# Patient Record
Sex: Female | Born: 1985
Health system: Southern US, Community
[De-identification: ages and names within clinical notes are randomized; demographics above are authoritative.]

## PROBLEM LIST (undated history)

## (undated) DIAGNOSIS — Z789 Other specified health status: Secondary | ICD-10-CM

## (undated) HISTORY — PX: TONSILLECTOMY: SUR1361

---
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
## 2007-08-25 ENCOUNTER — Encounter (INDEPENDENT_AMBULATORY_CARE_PROVIDER_SITE_OTHER): Payer: Self-pay | Admitting: Otolaryngology

## 2007-08-25 ENCOUNTER — Ambulatory Visit (HOSPITAL_BASED_OUTPATIENT_CLINIC_OR_DEPARTMENT_OTHER): Admission: RE | Admit: 2007-08-25 | Discharge: 2007-08-26 | Payer: Self-pay | Admitting: Otolaryngology

## 2011-01-23 NOTE — Op Note (Signed)
NAMEMIHIRA, TOZZI                 ACCOUNT NO.:  [id number]   MEDICAL RECORD NO.:  [id number]          PATIENT TYPE:  AMB   LOCATION:  DSC                          FACILITY:  [facility]   PHYSICIAN:  Karol T. Lazarus Salines, M.D. DATE OF BIRTH:  [birth year]   DATE OF PROCEDURE:  08/25/2007  DATE OF DISCHARGE:                               OPERATIVE REPORT   PREOPERATIVE DIAGNOSIS:  Recurrent tonsillitis.   POSTOPERATIVE DIAGNOSIS:  Recurrent tonsillitis.   PROCEDURE PERFORMED:  Tonsillectomy.   SURGEON:  Gloris Manchester. Lazarus Salines, M.D.   ANESTHESIA:  General orotracheal.   BLOOD LOSS:  None.   COMPLICATIONS:  None.   FINDINGS:  1+ imbedded tonsils with moderate fibrosis.  Normal soft  palate.  No residual adenoids.  Some congestion of the inferior  turbinates.   PROCEDURE:  With the patient in the comfortable supine position, general  orotracheal anesthesia was induced without difficulty.  At an  appropriate level, table was turned 90 degrees and the patient placed in  Trendelenburg.  A clean preparation and draping was accomplished.  Taking care to protect lips, teeth, and endotracheal tube, the Crowe-  Davis mouth gag was introduced, expanded for visualization, and  suspended from the Mayo stand in the standard fashion.  The findings  were as described above.  Palate retractor and mirror were used to  visualize the nasopharynx with the findings as described above.  Anterior nose was examined.  The nasal speculum the findings as  described above.  No adenoids were noted and no adenoidectomy was felt  necessary.  0.5% Xylocaine with 1:200,000 epinephrine, 8 mL total was  infiltrated into the peritonsillar planes for intraoperative hemostasis.  Several minutes were allowed for this to take effect.   Beginning on the left side, the tonsil was grasped and retracted  medially.  The mucosa overlying the anterior and superior poles was  coagulated and then cut down to the capsule of the tonsil.   Using the  cautery tip as a blunt dissector, lysing fibrous bands, and coagulating  crossing vessels as identified, the tonsil was dissected from its  muscular fossa from inferiorly upward.  A small additional quantity of  cautery rendered the fossa hemostatic.  The tonsil was removed in its  entirety as determined by examination both tonsil and fossa.   After completing left side, the right side was done in identical  fashion.   After completing both sides, hemostasis was observed.  The mouth gag was  relaxed for several minutes.  Upon re-expansion, hemostasis was  persistent.  At this point the procedure was completed.  Mouth gag was  relaxed and removed.  The dental status was intact.  The patient was  returned to Anesthesia, awakened, extubated, and transferred to recovery  in stable condition.   COMMENT:  25 year old white female with recurrent history of sore  throats was the indication for today's procedure.  Anticipate routine  postoperative recovery with attention to analgesia, antibiosis,  hydration, and observation for bleeding, emesis, or airway compromise.      Gloris Manchester. Lazarus Salines, M.D.  Electronically Signed  KTW/MEDQ  D:  08/25/2007  T:  08/25/2007  Job:  [id number]   cc:   Ernestina Penna, M.D.

## 2011-06-15 LAB — POCT HEMOGLOBIN-HEMACUE
Hemoglobin: 13.9
Operator id: 117931

## 2011-09-17 ENCOUNTER — Other Ambulatory Visit: Payer: Self-pay | Admitting: Family Medicine

## 2011-09-17 ENCOUNTER — Ambulatory Visit
Admission: RE | Admit: 2011-09-17 | Discharge: 2011-09-17 | Disposition: A | Discharge status: Home / Self Care | Payer: BC Managed Care – PPO | Source: Ambulatory Visit | Attending: Family Medicine | Admitting: Family Medicine

## 2011-09-17 DIAGNOSIS — M79604 Pain in right leg: Secondary | ICD-10-CM

## 2011-11-05 ENCOUNTER — Encounter (HOSPITAL_COMMUNITY): Payer: Self-pay | Admitting: Emergency Medicine

## 2011-11-05 ENCOUNTER — Emergency Department (HOSPITAL_COMMUNITY): Payer: BC Managed Care – PPO

## 2011-11-05 ENCOUNTER — Emergency Department (HOSPITAL_COMMUNITY)
Admission: EM | Admit: 2011-11-05 | Discharge: 2011-11-05 | Disposition: A | Discharge status: Home / Self Care | Payer: BC Managed Care – PPO | Attending: Emergency Medicine | Admitting: Emergency Medicine

## 2011-11-05 DIAGNOSIS — R10819 Abdominal tenderness, unspecified site: Secondary | ICD-10-CM | POA: Insufficient documentation

## 2011-11-05 DIAGNOSIS — Z79899 Other long term (current) drug therapy: Secondary | ICD-10-CM | POA: Insufficient documentation

## 2011-11-05 DIAGNOSIS — F17200 Nicotine dependence, unspecified, uncomplicated: Secondary | ICD-10-CM | POA: Insufficient documentation

## 2011-11-05 DIAGNOSIS — R1031 Right lower quadrant pain: Secondary | ICD-10-CM | POA: Insufficient documentation

## 2011-11-05 LAB — URINALYSIS, ROUTINE W REFLEX MICROSCOPIC
Bilirubin Urine: NEGATIVE
Glucose, UA: NEGATIVE mg/dL
Hgb urine dipstick: NEGATIVE
Ketones, ur: NEGATIVE mg/dL
Leukocytes, UA: NEGATIVE
Nitrite: NEGATIVE
Protein, ur: NEGATIVE mg/dL
Specific Gravity, Urine: 1.01 (ref 1.005–1.030)
Urobilinogen, UA: 0.2 mg/dL (ref 0.0–1.0)
pH: 7.5 (ref 5.0–8.0)

## 2011-11-05 LAB — COMPREHENSIVE METABOLIC PANEL
ALT: 48 U/L — ABNORMAL HIGH (ref 0–35)
AST: 134 U/L — ABNORMAL HIGH (ref 0–37)
Albumin: 4.1 g/dL (ref 3.5–5.2)
Alkaline Phosphatase: 58 U/L (ref 39–117)
BUN: 9 mg/dL (ref 6–23)
CO2: 25 mEq/L (ref 19–32)
Calcium: 9.4 mg/dL (ref 8.4–10.5)
Chloride: 104 mEq/L (ref 96–112)
Creatinine, Ser: 0.69 mg/dL (ref 0.50–1.10)
GFR calc Af Amer: >90 mL/min (ref >90)
GFR calc non Af Amer: >90 mL/min (ref >90)
Glucose, Bld: 96 mg/dL (ref 70–99)
Potassium: 3.7 mEq/L (ref 3.5–5.1)
Sodium: 138 mEq/L (ref 135–145)
Total Bilirubin: 0.5 mg/dL (ref 0.3–1.2)
Total Protein: 7.1 g/dL (ref 6.0–8.3)

## 2011-11-05 LAB — CBC
HCT: 40.3 % (ref 36.0–46.0)
Hemoglobin: 13.8 g/dL (ref 12.0–15.0)
MCH: 31.4 pg (ref 26.0–34.0)
MCHC: 34.2 g/dL (ref 30.0–36.0)
MCV: 91.6 fL (ref 78.0–100.0)
Platelets: 194 10*3/uL (ref 150–400)
RBC: 4.4 MIL/uL (ref 3.87–5.11)
RDW: 12.2 % (ref 11.5–15.5)
WBC: 9.1 10*3/uL (ref 4.0–10.5)

## 2011-11-05 LAB — DIFFERENTIAL
Basophils Absolute: 0 10*3/uL (ref 0.0–0.1)
Basophils Relative: 0 % (ref 0–1)
Eosinophils Absolute: 0.1 10*3/uL (ref 0.0–0.7)
Eosinophils Relative: 1 % (ref 0–5)
Lymphocytes Relative: 15 % (ref 12–46)
Lymphs Abs: 1.4 10*3/uL (ref 0.7–4.0)
Monocytes Absolute: 0.5 10*3/uL (ref 0.1–1.0)
Monocytes Relative: 5 % (ref 3–12)
Neutro Abs: 7.2 10*3/uL (ref 1.7–7.7)
Neutrophils Relative %: 79 % — ABNORMAL HIGH (ref 43–77)

## 2011-11-05 LAB — PREGNANCY, URINE: Preg Test, Ur: NEGATIVE

## 2011-11-05 LAB — LIPASE, BLOOD: Lipase: 22 U/L (ref 11–59)

## 2011-11-05 MED ORDER — ONDANSETRON HCL 4 MG/2ML IJ SOLN
INTRAMUSCULAR | Status: AC
  Filled 2011-11-05: qty 2

## 2011-11-05 MED ORDER — IOHEXOL 300 MG/ML  SOLN: 20.0000 mL | INTRAMUSCULAR | Status: AC

## 2011-11-05 MED ORDER — PROMETHAZINE HCL 25 MG PO TABS: 25.0000 mg | ORAL_TABLET | Freq: Four times a day (QID) | ORAL | Status: AC | PRN

## 2011-11-05 MED ORDER — PERCOCET 5-325 MG PO TABS: 1.0000 | ORAL_TABLET | Freq: Four times a day (QID) | ORAL | Status: AC | PRN

## 2011-11-05 MED ORDER — IOHEXOL 300 MG/ML  SOLN
80.0000 mL | Freq: Once | INTRAMUSCULAR | Status: AC | PRN
  Administered 2011-11-05: 80 mL via INTRAVENOUS

## 2011-11-05 MED ORDER — ONDANSETRON HCL 4 MG/2ML IJ SOLN
4.0000 mg | Freq: Once | INTRAMUSCULAR | Status: AC
  Administered 2011-11-05: 4 mg via INTRAVENOUS

## 2011-11-05 MED ORDER — ONDANSETRON HCL 4 MG/2ML IJ SOLN
4.0000 mg | Freq: Once | INTRAMUSCULAR | Status: AC
  Administered 2011-11-05: 4 mg via INTRAVENOUS
  Filled 2011-11-05: qty 2

## 2011-11-05 MED ORDER — SODIUM CHLORIDE 0.9 % IV BOLUS (SEPSIS)
1000.0000 mL | Freq: Once | INTRAVENOUS | Status: AC
  Administered 2011-11-05: 1000 mL via INTRAVENOUS

## 2011-11-05 NOTE — ED Notes (Signed)
Pt c/o RLQ pain; pt seen by PCP and sent here for eval for possible appy; pt sts nausea; pt denies UTI sx; pt sts had UA and upreg at PCP

## 2011-11-05 NOTE — Discharge Instructions (Signed)
Your testing here today only showed that your liver enzymes were mildly elevated. You will need to recheck with your doctor who may need to refer your to a GI specialist. This could be an evolving process that has not shown up on any of our testing here today. Monitor your condition and if there is any changes or worsening in your condition.

## 2011-11-05 NOTE — ED Provider Notes (Signed)
History     CSN: [id number]  Arrival date & time 11/05/11  1191   First MD Initiated Contact with Patient 11/05/11 0945      Chief Complaint  Patient presents with  . Abdominal Pain    (Consider location/radiation/quality/duration/timing/severity/associated sxs/prior treatment) HPI Patient presents to the ED complaining of RLQ abdominal pain that began around 2am this morning. She was seen at her PCP office and referred here for work up of possible appendicitis. Patient states that the pain is a heavy pressure that intermittently increases in intensity every few min and lasts for a few seconds at a time. She also had two episodes of vomiting this morning. She was unable to eat breakfast because of nausea. She has never had any pain like this before. LMP Oct 24, 2011. Patient denies fever, chills, sweats. She denies chest pain, shortness of breath, dizziness, lightheadedness, diarrhea, or changes in bowel habits. She denies dysuria, vaginal bleeding or discharge. Patient has never had abdominal surgery. Patient takes Abilify, Strattera, and Celexa. She is allergic to hydrocodone.  History reviewed. No pertinent past medical history.  History reviewed. No pertinent past surgical history.  History reviewed. No pertinent family history.  History  Substance Use Topics  . Smoking status: Current Everyday Smoker  . Smokeless tobacco: Not on file  . Alcohol Use: Yes     occasional    OB History    Grav Para Term Preterm Abortions TAB SAB Ect Mult Living                  Review of Systems All pertinent positives and negatives reviewed in the history of present illness  Allergies  Review of patient's allergies indicates no known allergies.  Home Medications   Current Outpatient Rx  Name Route Sig Dispense Refill  . ARIPIPRAZOLE 2 MG PO TABS Oral Take 2 mg by mouth daily.    . ATOMOXETINE HCL 80 MG PO CAPS Oral Take 80 mg by mouth daily.    Marland Kitchen CITALOPRAM HYDROBROMIDE 20 MG PO  TABS Oral Take 20 mg by mouth daily.      BP 116/72  Pulse 103  Temp(Src) 98 F (36.7 C) (Oral)  Resp 20  SpO2 100%  Physical Exam  Constitutional: She appears well-developed and well-nourished. No distress.  HENT:  Head: Normocephalic and atraumatic.  Neck: Neck supple.  Cardiovascular: Normal rate, regular rhythm and normal heart sounds.   Pulmonary/Chest: Effort normal and breath sounds normal. No respiratory distress.  Abdominal: Soft. Bowel sounds are normal. She exhibits no distension. There is tenderness (RLQ) in the right upper quadrant and right lower quadrant. There is tenderness at McBurney's point (negative Rovsings sign). There is no rigidity, no rebound and no guarding.    Lymphadenopathy:    She has no cervical adenopathy.  Skin: She is not diaphoretic.    ED Course  Procedures (including critical care time   The patient has been stable here in the ER. The patient has normal CT and [place] here in the ER. The patient states that she drinks regularly but will not give me a definite amount. The patient has some elevated liver enzymes but otherwise normal lab testing.She will be referred back to her PCP for further evaluation. I explained that the testing did not show any significant issue today but that this could be an evolving process and that she will need to return here for any worsening in her condition.   MDM  MDM Reviewed: nursing note and vitals  Interpretation: labs, ultrasound and CT scan            Carlyle Dolly, PA-C 11/05/11 1525  Carlyle Dolly, PA-C 11/05/11 1556

## 2011-11-05 NOTE — ED Notes (Signed)
Patient transported to CT 

## 2011-11-05 NOTE — ED Provider Notes (Signed)
Medical screening examination/treatment/procedure(s) were performed by non-physician practitioner and as supervising physician I was immediately available for consultation/collaboration. Devoria Albe, MD, FACEP   Ward Givens, MD 11/05/11 Ernestina Columbia

## 2014-07-20 ENCOUNTER — Other Ambulatory Visit: Payer: Self-pay | Admitting: Gynecology

## 2014-07-21 ENCOUNTER — Other Ambulatory Visit: Payer: Self-pay | Admitting: Internal Medicine

## 2014-07-21 DIAGNOSIS — E049 Nontoxic goiter, unspecified: Secondary | ICD-10-CM

## 2014-07-21 LAB — CYTOLOGY - PAP

## 2014-07-22 ENCOUNTER — Ambulatory Visit
Admission: RE | Admit: 2014-07-22 | Discharge: 2014-07-22 | Disposition: A | Discharge status: Home / Self Care | Payer: BC Managed Care – PPO | Source: Ambulatory Visit | Attending: Internal Medicine | Admitting: Internal Medicine

## 2014-07-22 DIAGNOSIS — E049 Nontoxic goiter, unspecified: Secondary | ICD-10-CM

## 2014-07-26 ENCOUNTER — Other Ambulatory Visit: Payer: Self-pay | Admitting: Internal Medicine

## 2014-07-26 DIAGNOSIS — E041 Nontoxic single thyroid nodule: Secondary | ICD-10-CM

## 2015-01-24 ENCOUNTER — Ambulatory Visit
Admission: RE | Admit: 2015-01-24 | Discharge: 2015-01-24 | Disposition: A | Discharge status: Home / Self Care | Payer: BLUE CROSS/BLUE SHIELD | Source: Ambulatory Visit | Attending: Internal Medicine | Admitting: Internal Medicine

## 2015-01-24 DIAGNOSIS — E041 Nontoxic single thyroid nodule: Secondary | ICD-10-CM

## 2015-10-19 ENCOUNTER — Other Ambulatory Visit: Payer: Self-pay | Admitting: Internal Medicine

## 2015-10-19 DIAGNOSIS — E041 Nontoxic single thyroid nodule: Secondary | ICD-10-CM

## 2015-11-18 ENCOUNTER — Other Ambulatory Visit: Payer: BLUE CROSS/BLUE SHIELD

## 2016-02-14 ENCOUNTER — Other Ambulatory Visit: Payer: BLUE CROSS/BLUE SHIELD

## 2016-02-24 ENCOUNTER — Ambulatory Visit
Admission: RE | Admit: 2016-02-24 | Discharge: 2016-02-24 | Disposition: A | Discharge status: Home / Self Care | Payer: BLUE CROSS/BLUE SHIELD | Source: Ambulatory Visit | Attending: Internal Medicine | Admitting: Internal Medicine

## 2016-02-24 DIAGNOSIS — E041 Nontoxic single thyroid nodule: Secondary | ICD-10-CM

## 2016-09-17 IMAGING — US US SOFT TISSUE HEAD/NECK
1 series · 14 of 25 positions shown · non-contrast
Comparison: None.

CLINICAL DATA: 28-year-old female with a history of thyroid
enlargement.

EXAM:
THYROID ULTRASOUND
TECHNIQUE: Ultrasound examination of the thyroid gland and adjacent soft
tissues was performed.

[Series 1: us soft tissue head/neck · 0.07mm/px · 14 of 40 slices shown]
[im 1/40]
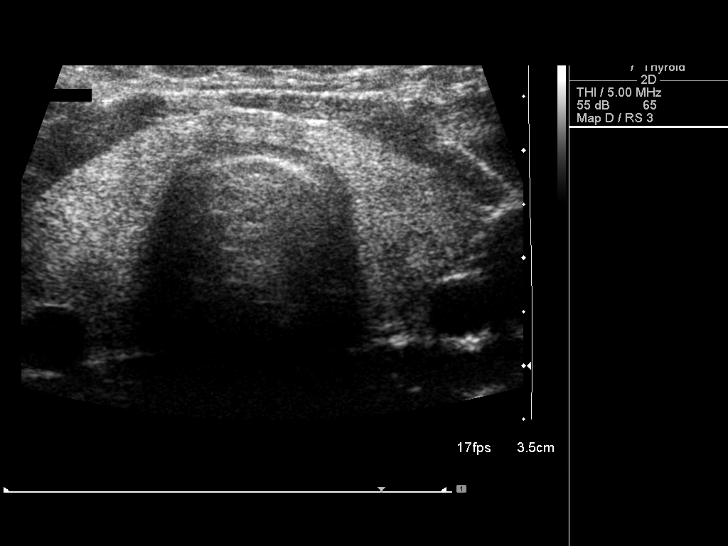
[im 4/40]
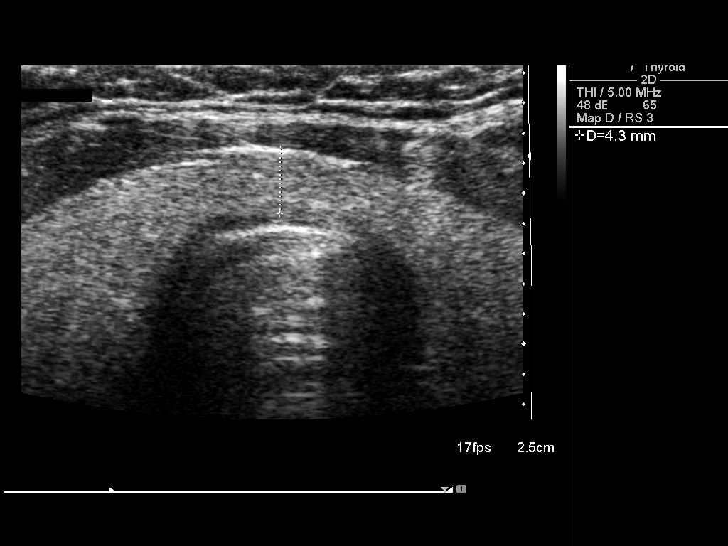
[im 7/40]
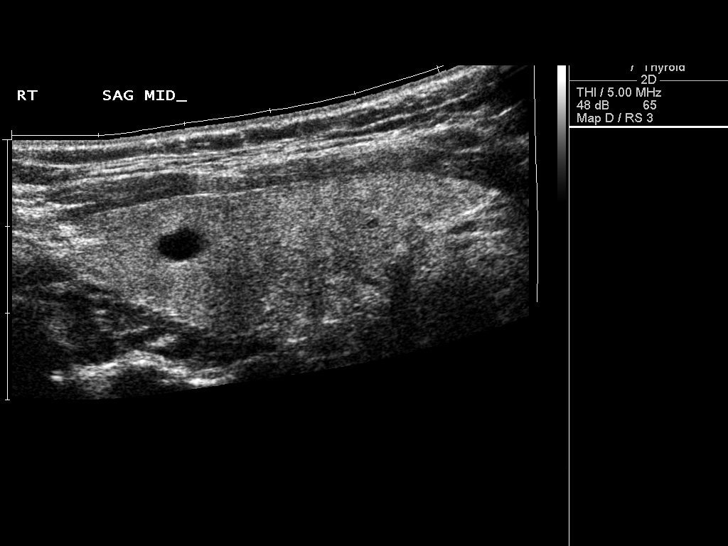
[im 10/40]
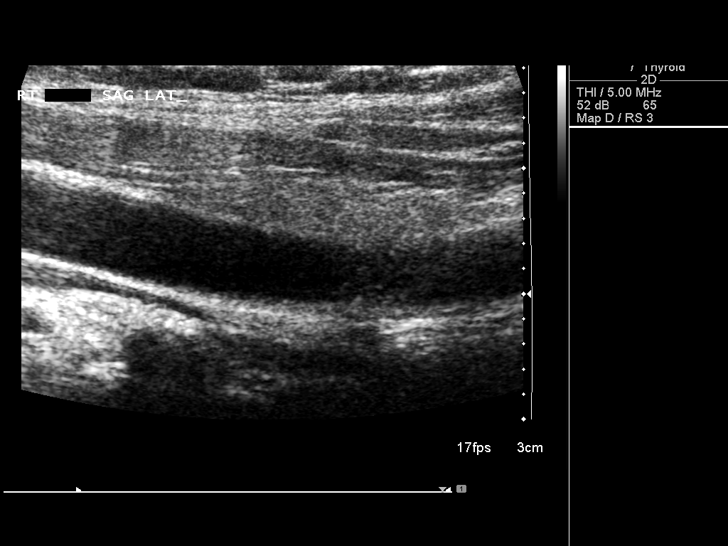
[im 14/40]
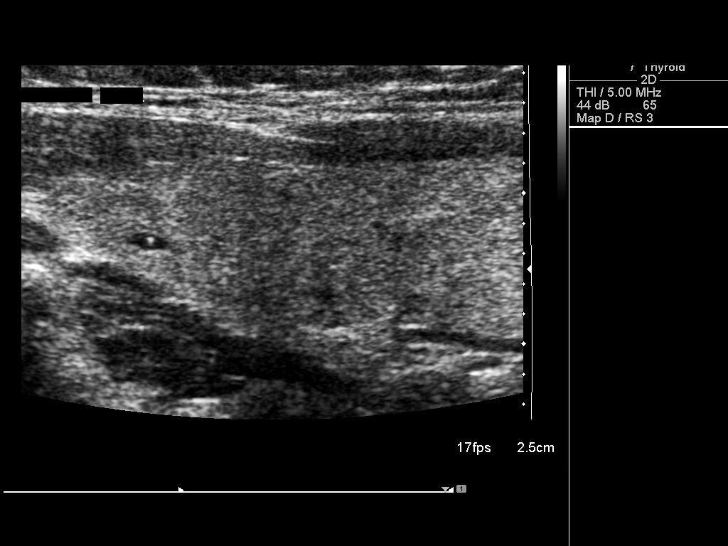
[im 15/40]
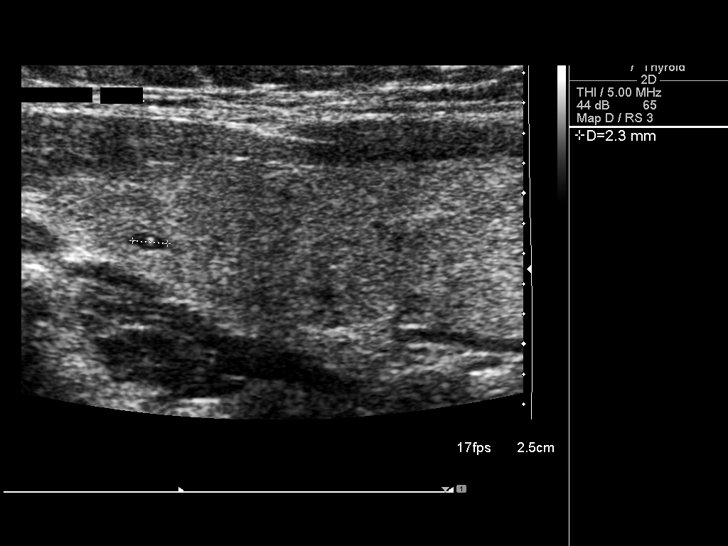
[im 18/40]
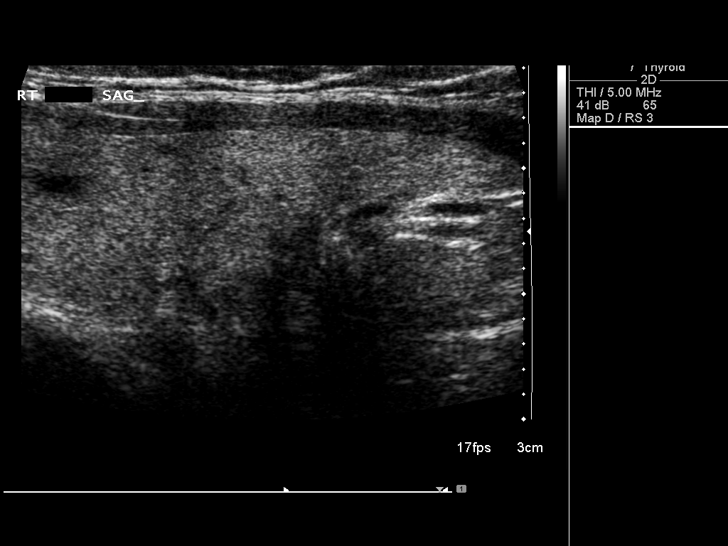
[im 22/40]
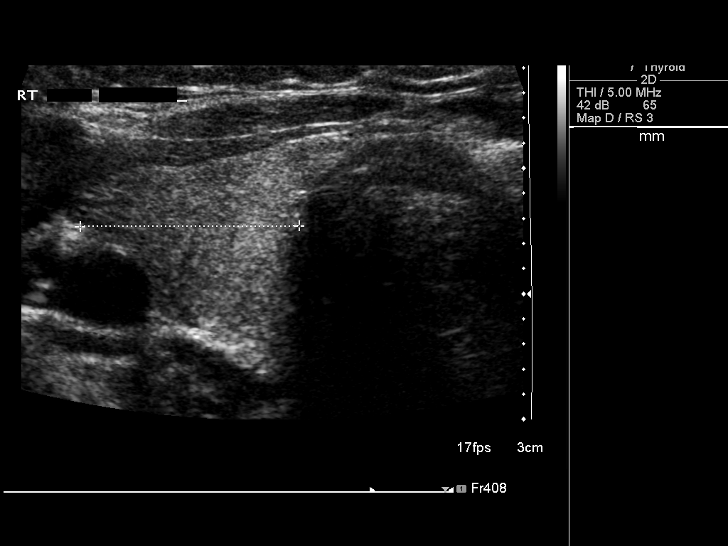
[im 25/40]
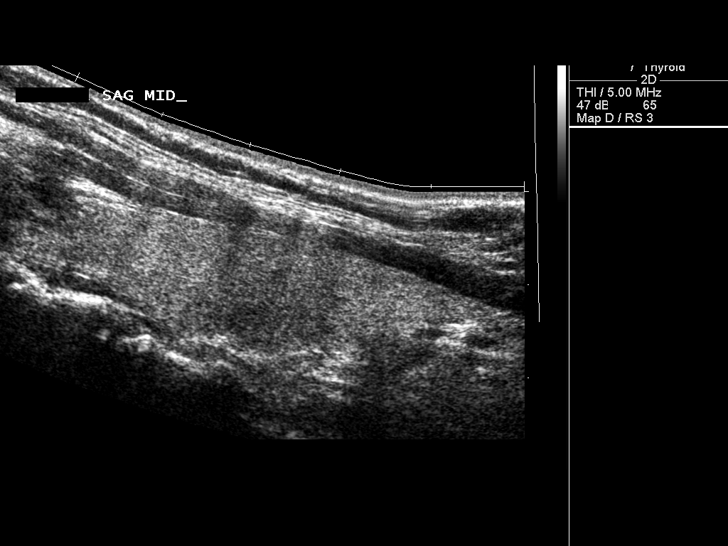
[im 27/40]
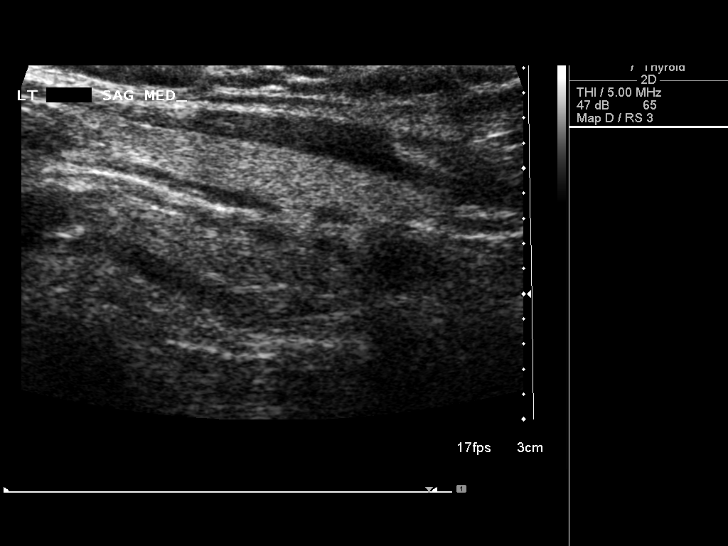
[im 30/40]
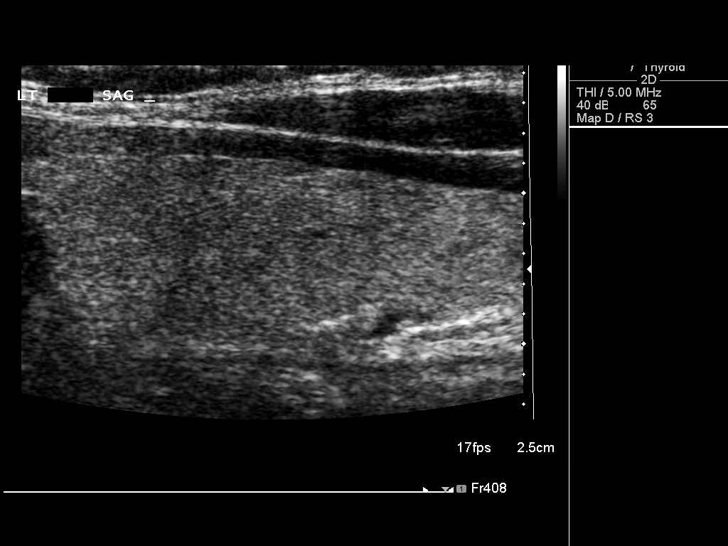
[im 33/40]
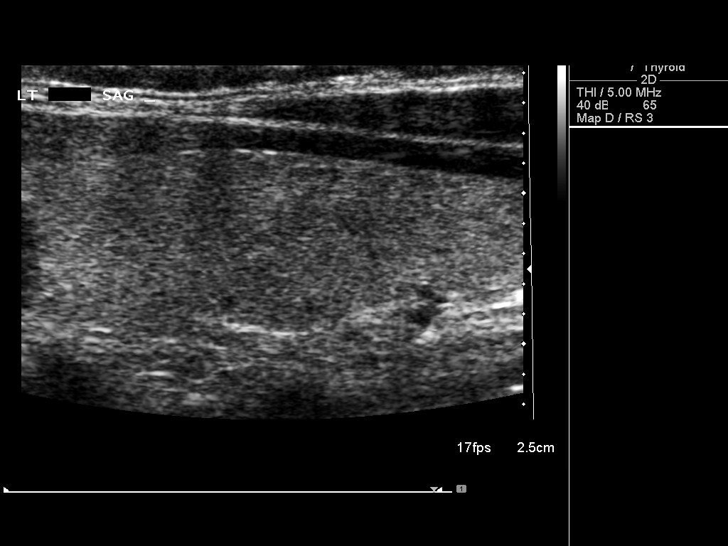
[im 36/40]
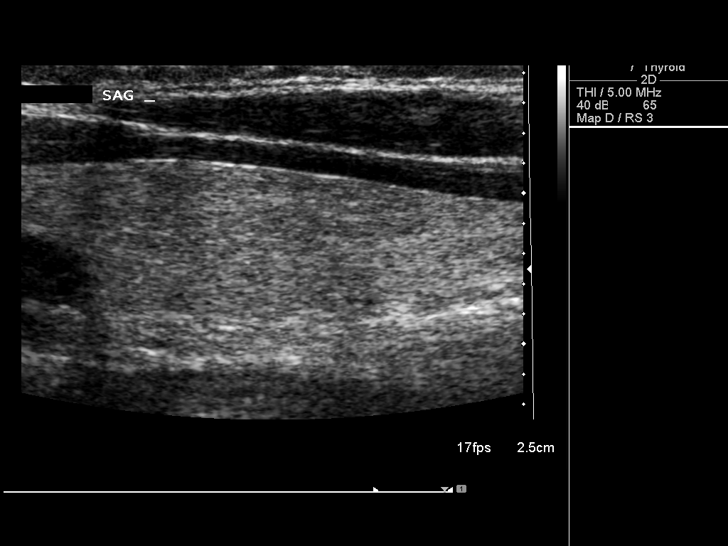
[im 40/40]
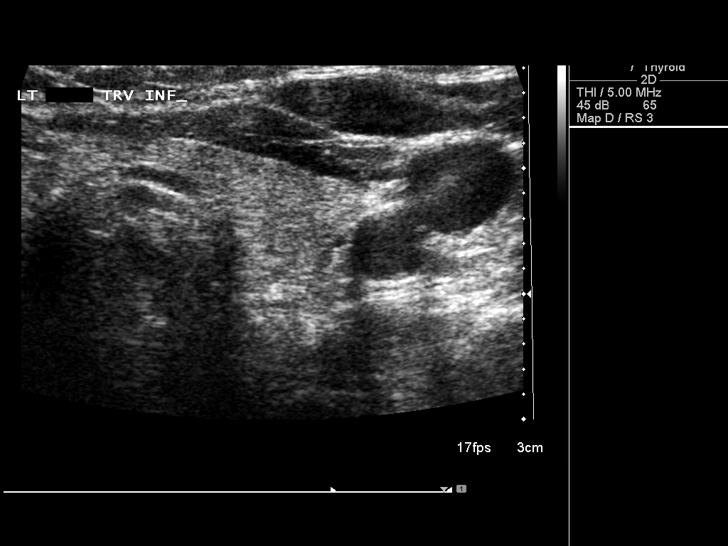

[14 of 25 positions shown; findings below may reference images not displayed]

FINDINGS: Right thyroid lobe

Measurements: 4.8 cm x 1.7 cm x 1.7 cm. Hypoechoic nodule of the
superior right thyroid measures 6 mm.

Left thyroid lobe

Measurements: 5.3 cm x 1.4 cm x 1.4 cm.  No nodules visualized.

Isthmus

Thickness: 4 mm.  No nodules visualized.

Lymphadenopathy

None visualized.
IMPRESSION: Single 6 mm right nodule identified with otherwise unremarkable
appearance of the thyroid gland.

Findings do not meet current SRU consensus criteria for biopsy.
Follow-up by clinical exam is recommended. If patient has known risk
factors for thyroid carcinoma, consider follow-up ultrasound in 12
months. If patient is clinically hyperthyroid, consider nuclear
medicine thyroid uptake and scan.Reference: Management of Thyroid
Nodules Detected at US: Society of Radiologists in Ultrasound

## 2016-11-21 ENCOUNTER — Other Ambulatory Visit: Payer: Self-pay | Admitting: Internal Medicine

## 2016-11-21 DIAGNOSIS — R110 Nausea: Secondary | ICD-10-CM

## 2016-11-21 DIAGNOSIS — R112 Nausea with vomiting, unspecified: Secondary | ICD-10-CM

## 2016-11-21 DIAGNOSIS — R109 Unspecified abdominal pain: Secondary | ICD-10-CM

## 2016-11-22 ENCOUNTER — Ambulatory Visit
Admission: RE | Admit: 2016-11-22 | Discharge: 2016-11-22 | Disposition: A | Discharge status: Home / Self Care | Payer: BLUE CROSS/BLUE SHIELD | Source: Ambulatory Visit | Attending: Internal Medicine | Admitting: Internal Medicine

## 2016-11-22 DIAGNOSIS — R110 Nausea: Secondary | ICD-10-CM

## 2016-11-22 DIAGNOSIS — R109 Unspecified abdominal pain: Secondary | ICD-10-CM

## 2016-11-22 DIAGNOSIS — R112 Nausea with vomiting, unspecified: Secondary | ICD-10-CM

## 2016-11-22 MED ORDER — IOPAMIDOL (ISOVUE-300) INJECTION 61%
100.0000 mL | Freq: Once | INTRAVENOUS | Status: AC | PRN
  Administered 2016-11-22: 100 mL via INTRAVENOUS

## 2017-03-22 IMAGING — US US SOFT TISSUE HEAD/NECK
1 series · 14 of 25 positions shown · non-contrast
Comparison: 07/22/2014

CLINICAL DATA: Thyroid nodules

EXAM:
THYROID ULTRASOUND
TECHNIQUE: Ultrasound examination of the thyroid gland and adjacent soft
tissues was performed.

[Series 1: us soft tissue head/neck · 0.06mm/px · 14 of 40 slices shown]
[im 1/40]
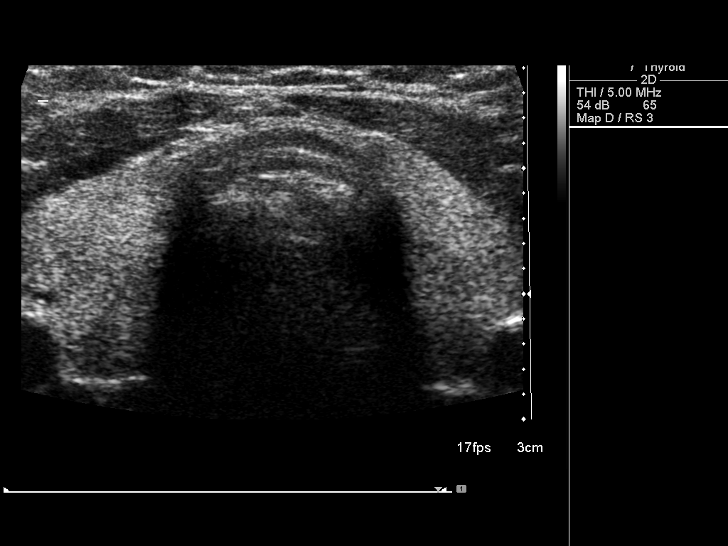
[im 4/40]
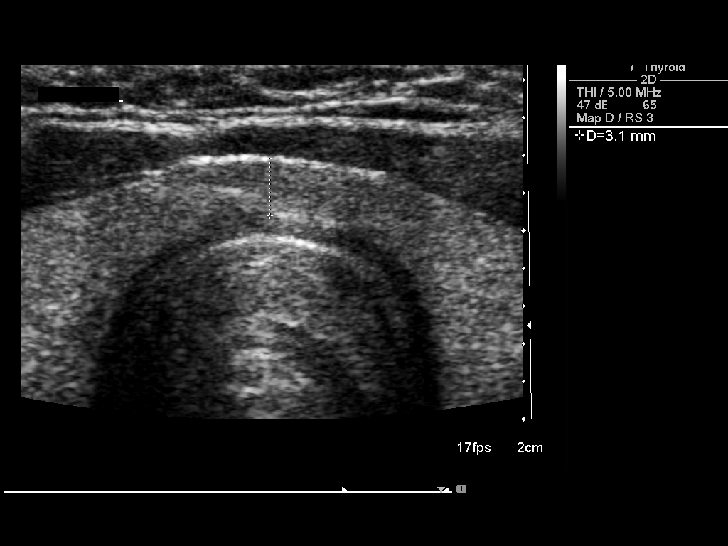
[im 7/40]
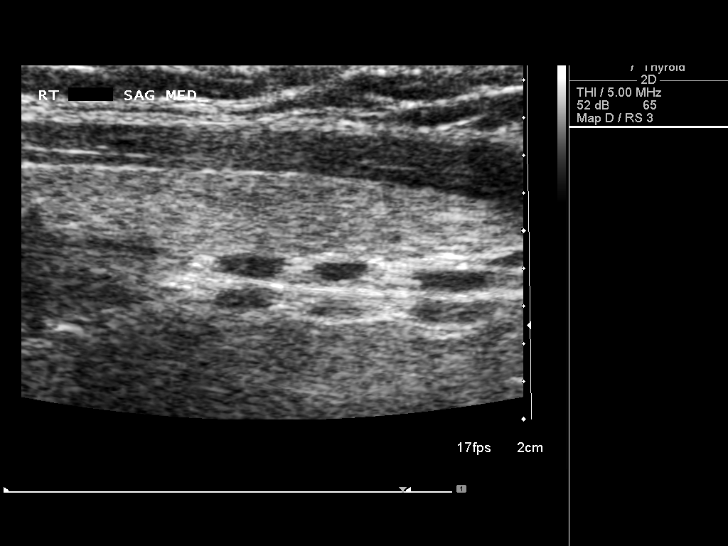
[im 10/40]
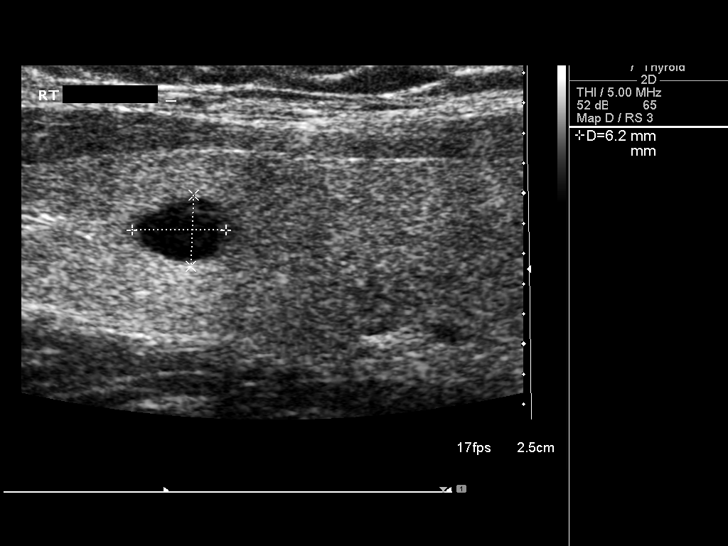
[im 14/40]
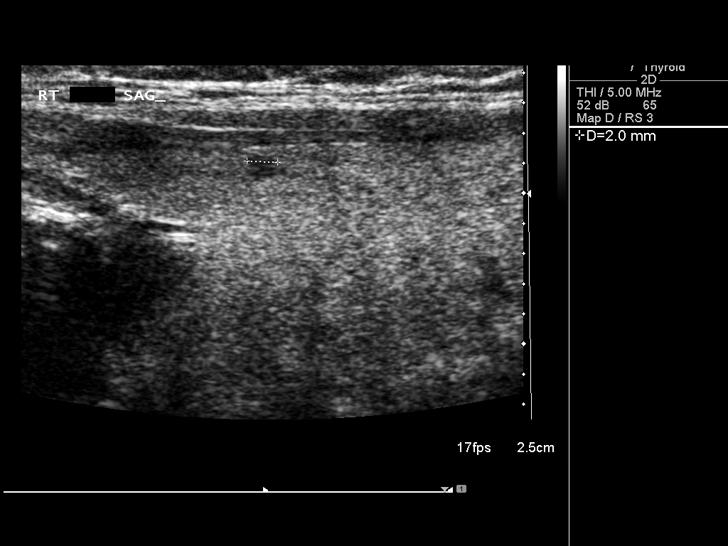
[im 15/40]
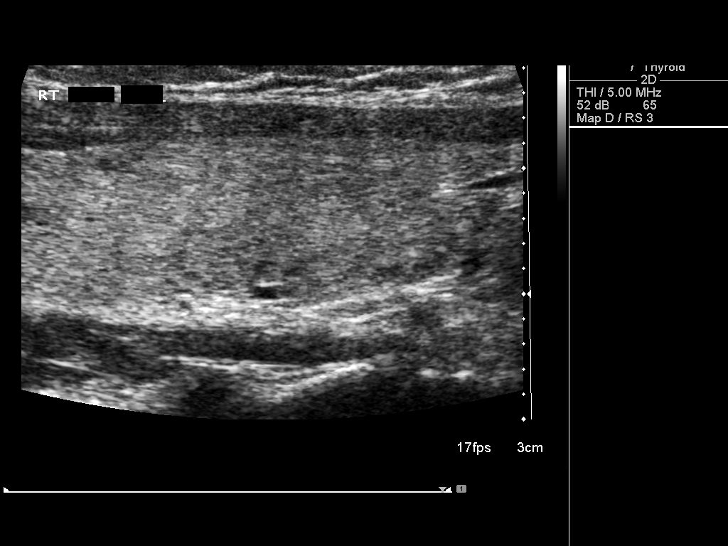
[im 18/40]
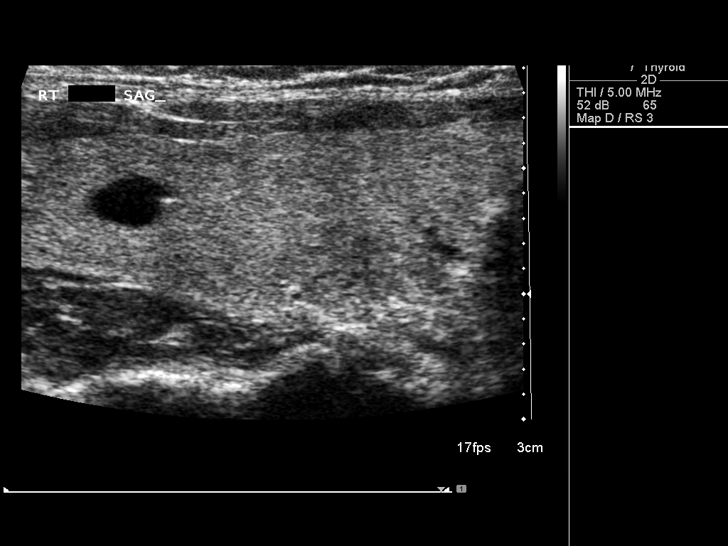
[im 22/40]
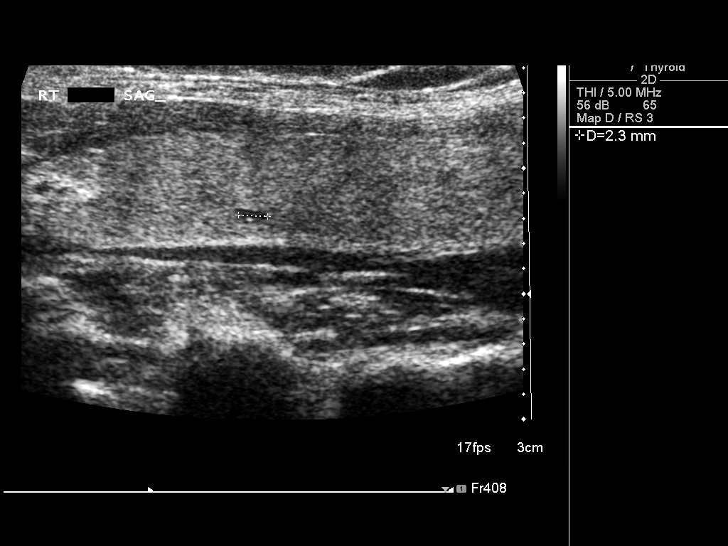
[im 25/40]
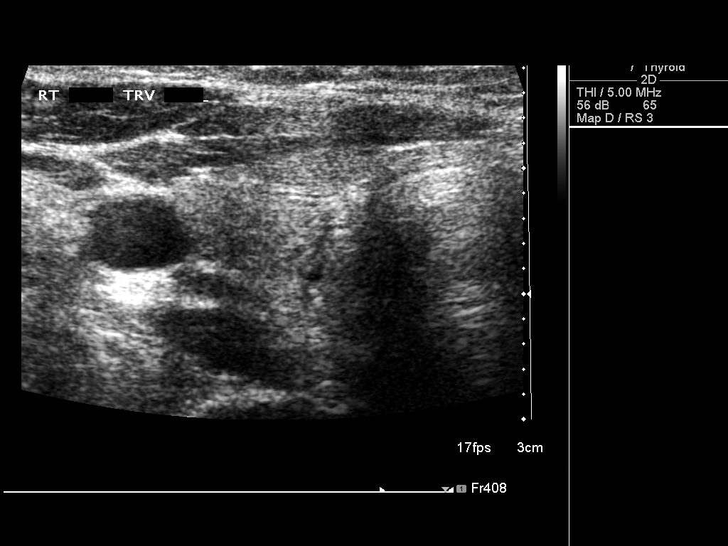
[im 27/40]
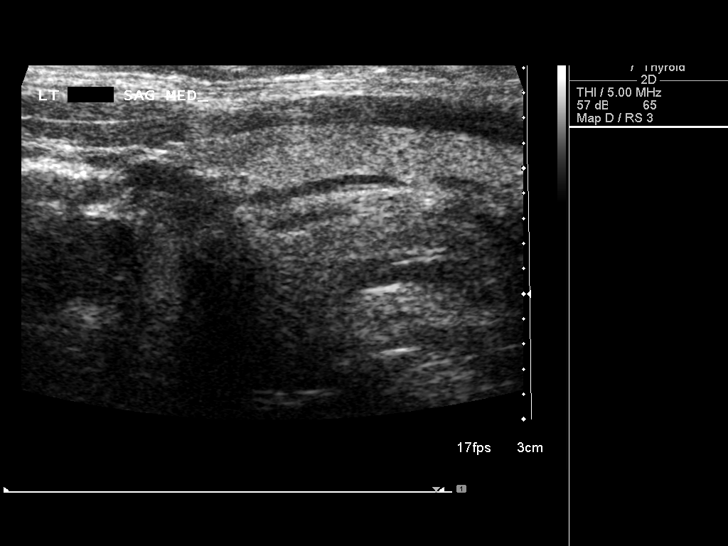
[im 30/40]
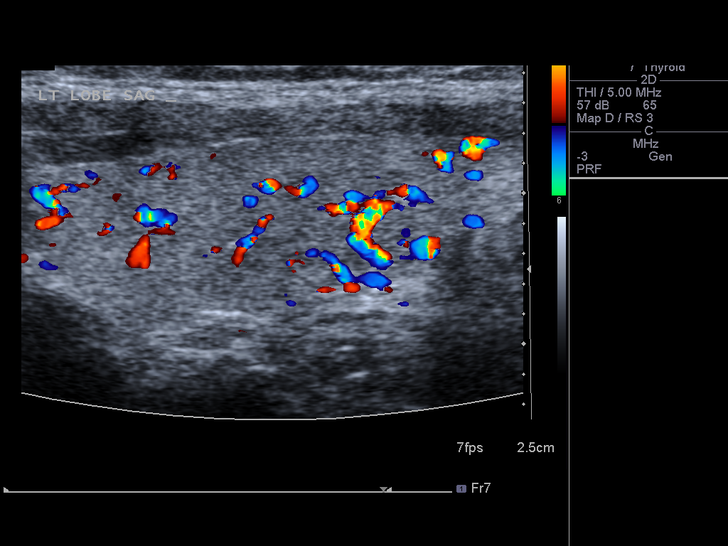
[im 33/40]
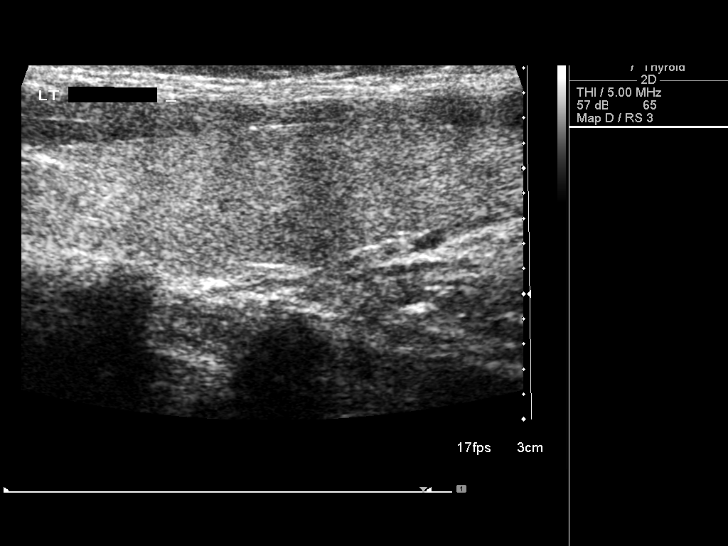
[im 36/40]
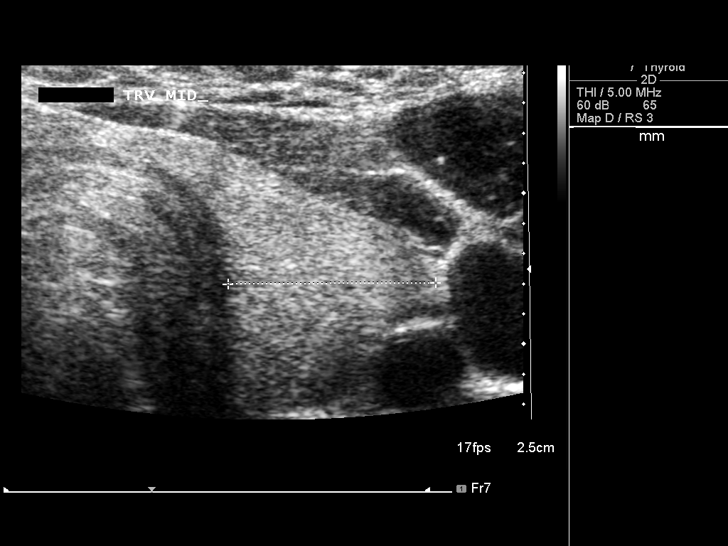
[im 40/40]
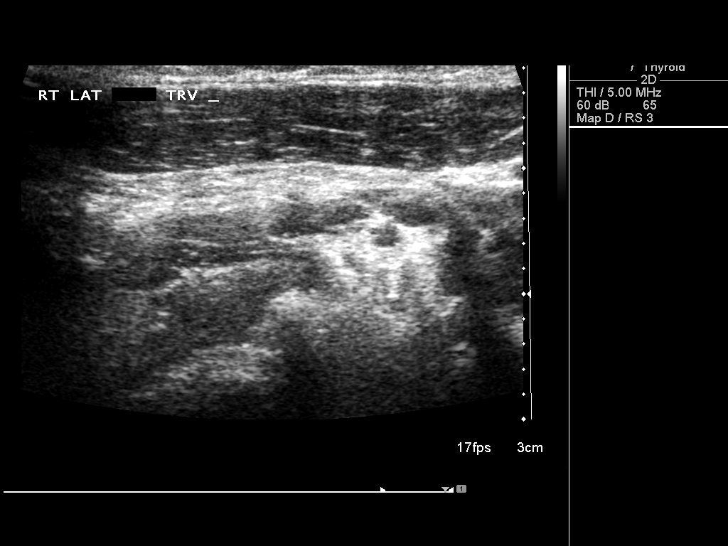

[14 of 25 positions shown; findings below may reference images not displayed]

FINDINGS: Right thyroid lobe

Measurements: 48 x 17 x 19 mm. 6 x 5 x 6 mm cyst, superior pole
(previously 6 mm). Several small hypoechoic nodules measuring 2 mm
or less.

Left thyroid lobe

Measurements: 46 x 15 x 14 mm.  No nodules visualized.

Isthmus

Thickness: 3 mm.  No nodules visualized.

Lymphadenopathy

None visualized.
IMPRESSION: 1. Stable 6 mm right thyroid cyst. Findings do not meet current
consensus criteria for biopsy. Follow-up by clinical exam is
recommended. If patient has known risk factors for thyroid
carcinoma, consider follow-up ultrasound in 12 months. If patient is
clinically hyperthyroid, consider nuclear medicine thyroid uptake
and scan. This recommendation follows the consensus statement:
Management of Thyroid Nodules Detected as US: Society of
Radiologists in Ultrasound Consensus Conference Statement. Radiology

## 2017-05-29 ENCOUNTER — Encounter: Payer: Self-pay | Admitting: *Deleted

## 2017-05-29 ENCOUNTER — Ambulatory Visit (INDEPENDENT_AMBULATORY_CARE_PROVIDER_SITE_OTHER): Payer: Self-pay | Admitting: *Deleted

## 2017-05-29 DIAGNOSIS — I781 Nevus, non-neoplastic: Secondary | ICD-10-CM

## 2017-05-29 NOTE — Progress Notes (Signed)
X=.3 Sotradecol administered with a 27g butterfly.  Patient received a total of 6cc.  Treated all areas of concern for this nice lady. Easy access. Tol well. Anticipate good results. Follow prn.     Compression stockings applied: Yes.

## 2017-07-30 DIAGNOSIS — Z0000 Encounter for general adult medical examination without abnormal findings: Secondary | ICD-10-CM | POA: Diagnosis not present

## 2017-09-25 DIAGNOSIS — Z6822 Body mass index (BMI) 22.0-22.9, adult: Secondary | ICD-10-CM | POA: Diagnosis not present

## 2017-09-25 DIAGNOSIS — Z01419 Encounter for gynecological examination (general) (routine) without abnormal findings: Secondary | ICD-10-CM | POA: Diagnosis not present

## 2018-05-19 DIAGNOSIS — M62830 Muscle spasm of back: Secondary | ICD-10-CM | POA: Diagnosis not present

## 2018-05-19 DIAGNOSIS — M9903 Segmental and somatic dysfunction of lumbar region: Secondary | ICD-10-CM | POA: Diagnosis not present

## 2018-05-21 DIAGNOSIS — M9903 Segmental and somatic dysfunction of lumbar region: Secondary | ICD-10-CM | POA: Diagnosis not present

## 2018-05-21 DIAGNOSIS — M9902 Segmental and somatic dysfunction of thoracic region: Secondary | ICD-10-CM | POA: Diagnosis not present

## 2018-05-21 DIAGNOSIS — M62830 Muscle spasm of back: Secondary | ICD-10-CM | POA: Diagnosis not present

## 2018-05-22 DIAGNOSIS — M9902 Segmental and somatic dysfunction of thoracic region: Secondary | ICD-10-CM | POA: Diagnosis not present

## 2018-05-22 DIAGNOSIS — M9903 Segmental and somatic dysfunction of lumbar region: Secondary | ICD-10-CM | POA: Diagnosis not present

## 2018-05-22 DIAGNOSIS — M62830 Muscle spasm of back: Secondary | ICD-10-CM | POA: Diagnosis not present

## 2018-05-26 DIAGNOSIS — M9903 Segmental and somatic dysfunction of lumbar region: Secondary | ICD-10-CM | POA: Diagnosis not present

## 2018-05-26 DIAGNOSIS — M9902 Segmental and somatic dysfunction of thoracic region: Secondary | ICD-10-CM | POA: Diagnosis not present

## 2018-05-26 DIAGNOSIS — M62830 Muscle spasm of back: Secondary | ICD-10-CM | POA: Diagnosis not present

## 2018-06-02 DIAGNOSIS — M9903 Segmental and somatic dysfunction of lumbar region: Secondary | ICD-10-CM | POA: Diagnosis not present

## 2018-06-02 DIAGNOSIS — M9902 Segmental and somatic dysfunction of thoracic region: Secondary | ICD-10-CM | POA: Diagnosis not present

## 2018-06-02 DIAGNOSIS — M62830 Muscle spasm of back: Secondary | ICD-10-CM | POA: Diagnosis not present

## 2018-06-04 DIAGNOSIS — M9903 Segmental and somatic dysfunction of lumbar region: Secondary | ICD-10-CM | POA: Diagnosis not present

## 2018-06-04 DIAGNOSIS — M62830 Muscle spasm of back: Secondary | ICD-10-CM | POA: Diagnosis not present

## 2018-06-04 DIAGNOSIS — M9902 Segmental and somatic dysfunction of thoracic region: Secondary | ICD-10-CM | POA: Diagnosis not present

## 2018-06-10 DIAGNOSIS — M9903 Segmental and somatic dysfunction of lumbar region: Secondary | ICD-10-CM | POA: Diagnosis not present

## 2018-06-10 DIAGNOSIS — M9902 Segmental and somatic dysfunction of thoracic region: Secondary | ICD-10-CM | POA: Diagnosis not present

## 2018-06-10 DIAGNOSIS — M62830 Muscle spasm of back: Secondary | ICD-10-CM | POA: Diagnosis not present

## 2018-06-17 DIAGNOSIS — M62830 Muscle spasm of back: Secondary | ICD-10-CM | POA: Diagnosis not present

## 2018-06-17 DIAGNOSIS — M9903 Segmental and somatic dysfunction of lumbar region: Secondary | ICD-10-CM | POA: Diagnosis not present

## 2018-06-17 DIAGNOSIS — M9902 Segmental and somatic dysfunction of thoracic region: Secondary | ICD-10-CM | POA: Diagnosis not present

## 2018-07-01 DIAGNOSIS — M9903 Segmental and somatic dysfunction of lumbar region: Secondary | ICD-10-CM | POA: Diagnosis not present

## 2018-07-01 DIAGNOSIS — M9902 Segmental and somatic dysfunction of thoracic region: Secondary | ICD-10-CM | POA: Diagnosis not present

## 2018-07-01 DIAGNOSIS — M62830 Muscle spasm of back: Secondary | ICD-10-CM | POA: Diagnosis not present

## 2018-07-08 DIAGNOSIS — M62830 Muscle spasm of back: Secondary | ICD-10-CM | POA: Diagnosis not present

## 2018-07-08 DIAGNOSIS — M9903 Segmental and somatic dysfunction of lumbar region: Secondary | ICD-10-CM | POA: Diagnosis not present

## 2018-07-08 DIAGNOSIS — M9902 Segmental and somatic dysfunction of thoracic region: Secondary | ICD-10-CM | POA: Diagnosis not present

## 2018-08-27 DIAGNOSIS — M9901 Segmental and somatic dysfunction of cervical region: Secondary | ICD-10-CM | POA: Diagnosis not present

## 2018-08-27 DIAGNOSIS — R51 Headache: Secondary | ICD-10-CM | POA: Diagnosis not present

## 2018-08-27 DIAGNOSIS — M9903 Segmental and somatic dysfunction of lumbar region: Secondary | ICD-10-CM | POA: Diagnosis not present

## 2018-08-28 DIAGNOSIS — M9901 Segmental and somatic dysfunction of cervical region: Secondary | ICD-10-CM | POA: Diagnosis not present

## 2018-08-28 DIAGNOSIS — M9903 Segmental and somatic dysfunction of lumbar region: Secondary | ICD-10-CM | POA: Diagnosis not present

## 2018-08-28 DIAGNOSIS — R51 Headache: Secondary | ICD-10-CM | POA: Diagnosis not present

## 2018-09-11 DIAGNOSIS — D225 Melanocytic nevi of trunk: Secondary | ICD-10-CM | POA: Diagnosis not present

## 2018-09-11 DIAGNOSIS — L821 Other seborrheic keratosis: Secondary | ICD-10-CM | POA: Diagnosis not present

## 2018-09-11 DIAGNOSIS — D2372 Other benign neoplasm of skin of left lower limb, including hip: Secondary | ICD-10-CM | POA: Diagnosis not present

## 2018-09-23 DIAGNOSIS — Z0000 Encounter for general adult medical examination without abnormal findings: Secondary | ICD-10-CM | POA: Diagnosis not present

## 2018-09-25 DIAGNOSIS — I8392 Asymptomatic varicose veins of left lower extremity: Secondary | ICD-10-CM | POA: Diagnosis not present

## 2018-09-25 DIAGNOSIS — Z0000 Encounter for general adult medical examination without abnormal findings: Secondary | ICD-10-CM | POA: Diagnosis not present

## 2018-10-27 DIAGNOSIS — Z6822 Body mass index (BMI) 22.0-22.9, adult: Secondary | ICD-10-CM | POA: Diagnosis not present

## 2018-10-27 DIAGNOSIS — Z01419 Encounter for gynecological examination (general) (routine) without abnormal findings: Secondary | ICD-10-CM | POA: Diagnosis not present

## 2018-12-01 DIAGNOSIS — M9901 Segmental and somatic dysfunction of cervical region: Secondary | ICD-10-CM | POA: Diagnosis not present

## 2018-12-01 DIAGNOSIS — R51 Headache: Secondary | ICD-10-CM | POA: Diagnosis not present

## 2018-12-01 DIAGNOSIS — M9903 Segmental and somatic dysfunction of lumbar region: Secondary | ICD-10-CM | POA: Diagnosis not present

## 2018-12-04 DIAGNOSIS — R51 Headache: Secondary | ICD-10-CM | POA: Diagnosis not present

## 2018-12-04 DIAGNOSIS — M9903 Segmental and somatic dysfunction of lumbar region: Secondary | ICD-10-CM | POA: Diagnosis not present

## 2018-12-04 DIAGNOSIS — M9901 Segmental and somatic dysfunction of cervical region: Secondary | ICD-10-CM | POA: Diagnosis not present

## 2019-04-30 ENCOUNTER — Other Ambulatory Visit: Payer: Self-pay

## 2019-04-30 DIAGNOSIS — Z20822 Contact with and (suspected) exposure to covid-19: Secondary | ICD-10-CM

## 2019-05-02 LAB — NOVEL CORONAVIRUS, NAA: SARS-CoV-2, NAA: NOT DETECTED

## 2019-07-08 ENCOUNTER — Other Ambulatory Visit: Payer: Self-pay

## 2019-07-08 DIAGNOSIS — Z20822 Contact with and (suspected) exposure to covid-19: Secondary | ICD-10-CM

## 2019-07-09 LAB — NOVEL CORONAVIRUS, NAA: SARS-CoV-2, NAA: NOT DETECTED

## 2020-08-25 LAB — OB RESULTS CONSOLE GC/CHLAMYDIA
Chlamydia: NEGATIVE
Gonorrhea: NEGATIVE

## 2021-02-10 NOTE — Patient Instructions (Addendum)
Carol Harper  02/10/2021   Your procedure is scheduled on:  02/22/2021  Arrive at 39 at Entrance C on [address] at [facility]  and [employer]. You are invited to use the FREE valet parking or use the Visitor's parking deck.  Pick up the phone at the desk and dial [phone].  Call this number if you have problems the morning of surgery: [phone]  Remember:   Do not eat food:(After Midnight) Desps de medianoche.  Do not drink clear liquids: (After Midnight) Desps de medianoche.  Take these medicines the morning of surgery with A SIP OF WATER:  none   Do not wear jewelry, make-up or nail polish.  Do not wear lotions, powders, or perfumes. Do not wear deodorant.  Do not shave 48 hours prior to surgery.  Do not bring valuables to the hospital.  [facility] is not   responsible for any belongings or valuables brought to the hospital.  Contacts, dentures or bridgework may not be worn into surgery.  Leave suitcase in the car. After surgery it may be brought to your room.  For patients admitted to the hospital, checkout time is 11:00 AM the day of              discharge.      Please read over the following fact sheets that you were given:     Preparing for Surgery

## 2021-02-13 ENCOUNTER — Encounter (HOSPITAL_COMMUNITY): Payer: Self-pay

## 2021-02-13 ENCOUNTER — Encounter (HOSPITAL_COMMUNITY): Payer: Self-pay | Admitting: *Deleted

## 2021-02-17 ENCOUNTER — Other Ambulatory Visit: Payer: Self-pay | Admitting: Obstetrics and Gynecology

## 2021-02-20 ENCOUNTER — Encounter (HOSPITAL_COMMUNITY)
Admission: RE | Admit: 2021-02-20 | Discharge: 2021-02-20 | Disposition: A | Discharge status: Home / Self Care | Payer: 59 | Source: Ambulatory Visit | Attending: Obstetrics and Gynecology | Admitting: Obstetrics and Gynecology

## 2021-02-20 ENCOUNTER — Other Ambulatory Visit (HOSPITAL_COMMUNITY)
Admission: RE | Admit: 2021-02-20 | Discharge: 2021-02-20 | Disposition: A | Discharge status: Home / Self Care | Payer: 59 | Source: Ambulatory Visit | Attending: Obstetrics and Gynecology | Admitting: Obstetrics and Gynecology

## 2021-02-20 ENCOUNTER — Other Ambulatory Visit: Payer: Self-pay

## 2021-02-20 DIAGNOSIS — Z01812 Encounter for preprocedural laboratory examination: Secondary | ICD-10-CM | POA: Insufficient documentation

## 2021-02-20 DIAGNOSIS — Z20822 Contact with and (suspected) exposure to covid-19: Secondary | ICD-10-CM | POA: Insufficient documentation

## 2021-02-20 HISTORY — DX: Other specified health status: Z78.9

## 2021-02-20 LAB — CBC
HCT: 36.3 % (ref 36.0–46.0)
Hemoglobin: 12 g/dL (ref 12.0–15.0)
MCH: 30.2 pg (ref 26.0–34.0)
MCHC: 33.1 g/dL (ref 30.0–36.0)
MCV: 91.4 fL (ref 80.0–100.0)
Platelets: 217 10*3/uL (ref 150–400)
RBC: 3.97 MIL/uL (ref 3.87–5.11)
RDW: 14.6 % (ref 11.5–15.5)
WBC: 13.6 10*3/uL — ABNORMAL HIGH (ref 4.0–10.5)
nRBC: 0 % (ref 0.0–0.2)

## 2021-02-20 LAB — SARS CORONAVIRUS 2 (TAT 6-24 HRS): SARS Coronavirus 2: NEGATIVE

## 2021-02-20 LAB — TYPE AND SCREEN
ABO/RH(D): O POS
Antibody Screen: NEGATIVE

## 2021-02-20 LAB — RPR: RPR Ser Ql: NONREACTIVE

## 2021-02-21 NOTE — Anesthesia Preprocedure Evaluation (Addendum)
Anesthesia Evaluation  Patient identified by MRN, date of birth, ID band Patient awake    Reviewed: Allergy & Precautions, NPO status , Patient's Chart, lab work & pertinent test results  Airway Mallampati: III  TM Distance: >3 FB Neck ROM: Full    Dental no notable dental hx. (+) Teeth Intact, Dental Advisory Given   Pulmonary neg pulmonary ROS,    Pulmonary exam normal breath sounds clear to auscultation       Cardiovascular negative cardio ROS Normal cardiovascular exam Rhythm:Regular Rate:Normal     Neuro/Psych negative neurological ROS  negative psych ROS   GI/Hepatic negative GI ROS, Neg liver ROS,   Endo/Other  negative endocrine ROS  Renal/GU negative Renal ROS  negative genitourinary   Musculoskeletal negative musculoskeletal ROS (+)   Abdominal   Peds  Hematology negative hematology ROS (+) hct 36.3, plt 217   Anesthesia Other Findings   Reproductive/Obstetrics (+) Pregnancy (breech presentation)                          Anesthesia Physical Anesthesia Plan  ASA: 2  Anesthesia Plan: Spinal   Post-op Pain Management:    Induction:   PONV Risk Score and Plan: 3 and Ondansetron, Dexamethasone and Treatment may vary due to age or medical condition  Airway Management Planned: Natural Airway and Nasal Cannula  Additional Equipment: None  Intra-op Plan:   Post-operative Plan:   Informed Consent:   Plan Discussed with:   Anesthesia Plan Comments:         Anesthesia Quick Evaluation

## 2021-02-22 ENCOUNTER — Encounter (HOSPITAL_COMMUNITY)
Admission: RE | Disposition: A | Discharge status: Home / Self Care | Payer: Self-pay | Source: Home / Self Care | Attending: Obstetrics and Gynecology

## 2021-02-22 ENCOUNTER — Inpatient Hospital Stay (HOSPITAL_COMMUNITY): Payer: 59 | Admitting: Anesthesiology

## 2021-02-22 ENCOUNTER — Encounter (HOSPITAL_COMMUNITY): Payer: Self-pay | Admitting: Obstetrics and Gynecology

## 2021-02-22 ENCOUNTER — Inpatient Hospital Stay (HOSPITAL_COMMUNITY)
Admission: RE | Admit: 2021-02-22 | Discharge: 2021-02-25 | DRG: 787 | Disposition: A | Discharge status: Home / Self Care | Payer: 59 | Attending: Obstetrics and Gynecology | Admitting: Obstetrics and Gynecology

## 2021-02-22 ENCOUNTER — Other Ambulatory Visit: Payer: Self-pay

## 2021-02-22 DIAGNOSIS — O9081 Anemia of the puerperium: Secondary | ICD-10-CM | POA: Diagnosis not present

## 2021-02-22 DIAGNOSIS — Z20822 Contact with and (suspected) exposure to covid-19: Secondary | ICD-10-CM | POA: Diagnosis present

## 2021-02-22 DIAGNOSIS — O99334 Smoking (tobacco) complicating childbirth: Secondary | ICD-10-CM | POA: Diagnosis present

## 2021-02-22 DIAGNOSIS — Z3A39 39 weeks gestation of pregnancy: Secondary | ICD-10-CM

## 2021-02-22 DIAGNOSIS — O9902 Anemia complicating childbirth: Secondary | ICD-10-CM | POA: Diagnosis not present

## 2021-02-22 DIAGNOSIS — O321XX0 Maternal care for breech presentation, not applicable or unspecified: Principal | ICD-10-CM | POA: Diagnosis present

## 2021-02-22 DIAGNOSIS — D62 Acute posthemorrhagic anemia: Secondary | ICD-10-CM | POA: Diagnosis not present

## 2021-02-22 SURGERY — Surgical Case
Anesthesia: Spinal

## 2021-02-22 MED ORDER — CEFAZOLIN SODIUM-DEXTROSE 2-3 GM-%(50ML) IV SOLR
INTRAVENOUS | Status: DC | PRN
  Administered 2021-02-22: 2 g via INTRAVENOUS

## 2021-02-22 MED ORDER — LACTATED RINGERS IV SOLN: INTRAVENOUS | Status: DC | PRN

## 2021-02-22 MED ORDER — CEFAZOLIN SODIUM-DEXTROSE 2-4 GM/100ML-% IV SOLN
INTRAVENOUS | Status: AC
  Filled 2021-02-22: qty 100

## 2021-02-22 MED ORDER — DIPHENHYDRAMINE HCL 25 MG PO CAPS
25.0000 mg | ORAL_CAPSULE | Freq: Four times a day (QID) | ORAL | Status: DC | PRN
  Administered 2021-02-22 – 2021-02-23 (×2): 25 mg via ORAL
  Filled 2021-02-22 (×2): qty 1

## 2021-02-22 MED ORDER — TETANUS-DIPHTH-ACELL PERTUSSIS 5-2.5-18.5 LF-MCG/0.5 IM SUSY: 0.5000 mL | PREFILLED_SYRINGE | Freq: Once | INTRAMUSCULAR | Status: DC

## 2021-02-22 MED ORDER — MORPHINE SULFATE (PF) 0.5 MG/ML IJ SOLN
INTRAMUSCULAR | Status: AC
  Filled 2021-02-22: qty 10

## 2021-02-22 MED ORDER — OXYCODONE-ACETAMINOPHEN 5-325 MG PO TABS: 1.0000 | ORAL_TABLET | ORAL | Status: DC | PRN

## 2021-02-22 MED ORDER — DIBUCAINE (PERIANAL) 1 % EX OINT: 1.0000 "application " | TOPICAL_OINTMENT | CUTANEOUS | Status: DC | PRN

## 2021-02-22 MED ORDER — KETOROLAC TROMETHAMINE 30 MG/ML IJ SOLN
INTRAMUSCULAR | Status: AC
  Filled 2021-02-22: qty 1

## 2021-02-22 MED ORDER — METHYLERGONOVINE MALEATE 0.2 MG/ML IJ SOLN: 0.2000 mg | INTRAMUSCULAR | Status: DC | PRN

## 2021-02-22 MED ORDER — PRENATAL MULTIVITAMIN CH
1.0000 | ORAL_TABLET | Freq: Every day | ORAL | Status: DC
  Administered 2021-02-23 – 2021-02-25 (×3): 1 via ORAL
  Filled 2021-02-22 (×3): qty 1

## 2021-02-22 MED ORDER — OXYTOCIN-SODIUM CHLORIDE 30-0.9 UT/500ML-% IV SOLN
INTRAVENOUS | Status: DC | PRN
  Administered 2021-02-22: 30 mL via INTRAVENOUS

## 2021-02-22 MED ORDER — COCONUT OIL OIL: 1.0000 "application " | TOPICAL_OIL | Status: DC | PRN

## 2021-02-22 MED ORDER — OXYTOCIN-SODIUM CHLORIDE 30-0.9 UT/500ML-% IV SOLN
2.5000 [IU]/h | INTRAVENOUS | Status: AC
  Administered 2021-02-22: 2.5 [IU]/h via INTRAVENOUS
  Filled 2021-02-22: qty 500

## 2021-02-22 MED ORDER — ONDANSETRON HCL 4 MG/2ML IJ SOLN
INTRAMUSCULAR | Status: AC
  Filled 2021-02-22: qty 2

## 2021-02-22 MED ORDER — SIMETHICONE 80 MG PO CHEW
80.0000 mg | CHEWABLE_TABLET | Freq: Three times a day (TID) | ORAL | Status: DC
  Administered 2021-02-23 – 2021-02-25 (×7): 80 mg via ORAL
  Filled 2021-02-22 (×7): qty 1

## 2021-02-22 MED ORDER — DEXAMETHASONE SODIUM PHOSPHATE 10 MG/ML IJ SOLN
INTRAMUSCULAR | Status: AC
  Filled 2021-02-22: qty 1

## 2021-02-22 MED ORDER — WITCH HAZEL-GLYCERIN EX PADS: 1.0000 "application " | MEDICATED_PAD | CUTANEOUS | Status: DC | PRN

## 2021-02-22 MED ORDER — IBUPROFEN 600 MG PO TABS
600.0000 mg | ORAL_TABLET | Freq: Four times a day (QID) | ORAL | Status: DC
  Administered 2021-02-23 – 2021-02-25 (×9): 600 mg via ORAL
  Filled 2021-02-22 (×9): qty 1

## 2021-02-22 MED ORDER — POVIDONE-IODINE 10 % EX SWAB
2.0000 "application " | Freq: Once | CUTANEOUS | Status: AC
  Administered 2021-02-22: 2 via TOPICAL

## 2021-02-22 MED ORDER — BUPIVACAINE HCL (PF) 0.25 % IJ SOLN
INTRAMUSCULAR | Status: DC | PRN
  Administered 2021-02-22: 20 mL

## 2021-02-22 MED ORDER — MORPHINE SULFATE (PF) 0.5 MG/ML IJ SOLN
INTRAMUSCULAR | Status: DC | PRN
  Administered 2021-02-22: 150 ug via INTRATHECAL

## 2021-02-22 MED ORDER — FENTANYL CITRATE (PF) 100 MCG/2ML IJ SOLN
INTRAMUSCULAR | Status: AC
  Filled 2021-02-22: qty 2

## 2021-02-22 MED ORDER — BUPIVACAINE IN DEXTROSE 0.75-8.25 % IT SOLN
INTRATHECAL | Status: DC | PRN
  Administered 2021-02-22: 1.5 mL via INTRATHECAL

## 2021-02-22 MED ORDER — SIMETHICONE 80 MG PO CHEW: 80.0000 mg | CHEWABLE_TABLET | ORAL | Status: DC | PRN

## 2021-02-22 MED ORDER — MENTHOL 3 MG MT LOZG: 1.0000 | LOZENGE | OROMUCOSAL | Status: DC | PRN

## 2021-02-22 MED ORDER — ONDANSETRON HCL 4 MG/2ML IJ SOLN
INTRAMUSCULAR | Status: DC | PRN
  Administered 2021-02-22 (×2): 4 mg via INTRAVENOUS

## 2021-02-22 MED ORDER — METHYLERGONOVINE MALEATE 0.2 MG PO TABS: 0.2000 mg | ORAL_TABLET | ORAL | Status: DC | PRN

## 2021-02-22 MED ORDER — BUPIVACAINE HCL (PF) 0.25 % IJ SOLN
INTRAMUSCULAR | Status: AC
  Filled 2021-02-22: qty 20

## 2021-02-22 MED ORDER — KETOROLAC TROMETHAMINE 30 MG/ML IJ SOLN
30.0000 mg | Freq: Four times a day (QID) | INTRAMUSCULAR | Status: AC
  Administered 2021-02-22 – 2021-02-23 (×2): 30 mg via INTRAVENOUS
  Filled 2021-02-22 (×2): qty 1

## 2021-02-22 MED ORDER — CEFAZOLIN SODIUM-DEXTROSE 2-4 GM/100ML-% IV SOLN: 2.0000 g | INTRAVENOUS | Status: DC

## 2021-02-22 MED ORDER — SENNOSIDES-DOCUSATE SODIUM 8.6-50 MG PO TABS
2.0000 | ORAL_TABLET | Freq: Every day | ORAL | Status: DC
  Administered 2021-02-23 – 2021-02-25 (×3): 2 via ORAL
  Filled 2021-02-22 (×3): qty 2

## 2021-02-22 MED ORDER — KETOROLAC TROMETHAMINE 30 MG/ML IJ SOLN: 30.0000 mg | Freq: Four times a day (QID) | INTRAMUSCULAR | Status: DC | PRN

## 2021-02-22 MED ORDER — OXYTOCIN-SODIUM CHLORIDE 30-0.9 UT/500ML-% IV SOLN
INTRAVENOUS | Status: AC
  Filled 2021-02-22: qty 500

## 2021-02-22 MED ORDER — DEXAMETHASONE SODIUM PHOSPHATE 4 MG/ML IJ SOLN
INTRAMUSCULAR | Status: DC | PRN
  Administered 2021-02-22: 10 mg via INTRAVENOUS

## 2021-02-22 MED ORDER — LACTATED RINGERS IV SOLN: INTRAVENOUS | Status: DC

## 2021-02-22 MED ORDER — PHENYLEPHRINE HCL-NACL 20-0.9 MG/250ML-% IV SOLN
INTRAVENOUS | Status: DC | PRN
  Administered 2021-02-22: 60 ug/min via INTRAVENOUS

## 2021-02-22 MED ORDER — PHENYLEPHRINE HCL-NACL 20-0.9 MG/250ML-% IV SOLN
INTRAVENOUS | Status: AC
  Filled 2021-02-22: qty 250

## 2021-02-22 MED ORDER — ZOLPIDEM TARTRATE 5 MG PO TABS: 5.0000 mg | ORAL_TABLET | Freq: Every evening | ORAL | Status: DC | PRN

## 2021-02-22 MED ORDER — FENTANYL CITRATE (PF) 100 MCG/2ML IJ SOLN
INTRAMUSCULAR | Status: DC | PRN
  Administered 2021-02-22: 15 ug via INTRATHECAL

## 2021-02-22 MED ORDER — KETOROLAC TROMETHAMINE 30 MG/ML IJ SOLN
30.0000 mg | Freq: Four times a day (QID) | INTRAMUSCULAR | Status: DC | PRN
  Administered 2021-02-22: 30 mg via INTRAMUSCULAR

## 2021-02-22 MED ORDER — EPHEDRINE 5 MG/ML INJ
INTRAVENOUS | Status: AC
  Filled 2021-02-22: qty 10

## 2021-02-22 SURGICAL SUPPLY — 37 items
BENZOIN TINCTURE PRP APPL 2/3 (GAUZE/BANDAGES/DRESSINGS) ×2 IMPLANT
CHLORAPREP W/TINT 26ML (MISCELLANEOUS) ×2 IMPLANT
CLAMP CORD UMBIL (MISCELLANEOUS) IMPLANT
CLOSURE STERI STRIP 1/2 X4 (GAUZE/BANDAGES/DRESSINGS) ×2 IMPLANT
CLOTH BEACON ORANGE TIMEOUT ST (SAFETY) ×2 IMPLANT
DRSG OPSITE POSTOP 4X10 (GAUZE/BANDAGES/DRESSINGS) ×2 IMPLANT
ELECT REM PT RETURN 9FT ADLT (ELECTROSURGICAL) ×2
ELECTRODE REM PT RTRN 9FT ADLT (ELECTROSURGICAL) ×1 IMPLANT
EXTRACTOR VACUUM M CUP 4 TUBE (SUCTIONS) IMPLANT
GAUZE SPONGE 4X4 12PLY STRL LF (GAUZE/BANDAGES/DRESSINGS) ×4 IMPLANT
GLOVE BIO SURGEON STRL SZ7.5 (GLOVE) ×2 IMPLANT
GLOVE BIOGEL PI IND STRL 7.0 (GLOVE) ×1 IMPLANT
GLOVE BIOGEL PI INDICATOR 7.0 (GLOVE) ×1
GOWN STRL REUS W/TWL LRG LVL3 (GOWN DISPOSABLE) ×4 IMPLANT
KIT ABG SYR 3ML LUER SLIP (SYRINGE) IMPLANT
NEEDLE HYPO 22GX1.5 SAFETY (NEEDLE) ×2 IMPLANT
NEEDLE HYPO 25X5/8 SAFETYGLIDE (NEEDLE) IMPLANT
NEEDLE SPNL 20GX3.5 QUINCKE YW (NEEDLE) IMPLANT
NS IRRIG 1000ML POUR BTL (IV SOLUTION) ×2 IMPLANT
PACK C SECTION WH (CUSTOM PROCEDURE TRAY) ×2 IMPLANT
PAD ABD 7.5X8 STRL (GAUZE/BANDAGES/DRESSINGS) ×4 IMPLANT
PENCIL SMOKE EVAC W/HOLSTER (ELECTROSURGICAL) ×2 IMPLANT
SUT MNCRL 0 VIOLET CTX 36 (SUTURE) ×2 IMPLANT
SUT MNCRL AB 3-0 PS2 27 (SUTURE) IMPLANT
SUT MON AB 2-0 CT1 27 (SUTURE) ×2 IMPLANT
SUT MON AB-0 CT1 36 (SUTURE) ×4 IMPLANT
SUT MONOCRYL 0 CTX 36 (SUTURE) ×2
SUT PLAIN 0 NONE (SUTURE) IMPLANT
SUT PLAIN 2 0 (SUTURE)
SUT PLAIN 2 0 XLH (SUTURE) ×2 IMPLANT
SUT PLAIN ABS 2-0 CT1 27XMFL (SUTURE) IMPLANT
SUT VIC AB 4-0 KS 27 (SUTURE) ×2 IMPLANT
SYR 20CC LL (SYRINGE) IMPLANT
SYR CONTROL 10ML LL (SYRINGE) ×2 IMPLANT
TOWEL OR 17X24 6PK STRL BLUE (TOWEL DISPOSABLE) ×2 IMPLANT
TRAY FOLEY W/BAG SLVR 14FR LF (SET/KITS/TRAYS/PACK) ×2 IMPLANT
WATER STERILE IRR 1000ML POUR (IV SOLUTION) ×2 IMPLANT

## 2021-02-22 NOTE — Transfer of Care (Signed)
Immediate Anesthesia Transfer of Care Note  Patient: Carol Harper  Procedure(s) Performed: Primary CESAREAN SECTION  Patient Location: PACU  Anesthesia Type:Spinal  Level of Consciousness: awake, alert  and oriented  Airway & Oxygen Therapy: Patient Spontanous Breathing  Post-op Assessment: Report given to RN and Post -op Vital signs reviewed and stable  Post vital signs: Reviewed and stable  Last Vitals:  Vitals Value Taken Time  BP 94/68 02/22/21 1658  Temp    Pulse 101 02/22/21 1659  Resp 15 02/22/21 1659  SpO2 100 % 02/22/21 1659  Vitals shown include unvalidated device data.  Last Pain:  Vitals:   02/22/21 1244  TempSrc: Oral         Complications: No notable events documented.

## 2021-02-22 NOTE — H&P (Addendum)
Carol Harper is a 35 y.o. female presenting for primary csection for breech. OB History     Gravida  1   Para      Term      Preterm      AB      Living         SAB      IAB      Ectopic      Multiple      Live Births             Past Medical History:  Diagnosis Date   Medical history non-contributory    Past Surgical History:  Procedure Laterality Date   TONSILLECTOMY     Family History: family history is not on file. Social History:  reports that she has been smoking. She has never used smokeless tobacco. She reports current alcohol use. She reports that she does not use drugs.     Maternal Diabetes: No Genetic Screening: Normal Maternal Ultrasounds/Referrals: Normal Fetal Ultrasounds or other Referrals:  None Maternal Substance Abuse:  No Significant Maternal Medications:  None Significant Maternal Lab Results:  Group B Strep negative Other Comments:  None  Review of Systems  Constitutional: Negative.   All other systems reviewed and are negative. Maternal Medical History:  Reason for admission: Contractions.   Contractions: Frequency: rare.   Perceived severity is mild.   Fetal activity: Last perceived fetal movement was within the past hour.   Prenatal complications: no prenatal complications Prenatal Complications - Diabetes: none.    There were no vitals taken for this visit. Maternal Exam:  Uterine Assessment: Contraction strength is mild.  Contraction frequency is rare.  Abdomen: Patient reports no abdominal tenderness. Introitus: Normal vulva. Normal vagina.  Ferning test: not done.  Nitrazine test: not done. Amniotic fluid character: not assessed. Pelvis: of concern for delivery.   Cervix: Cervix evaluated by digital exam.    Physical Exam Constitutional:      Appearance: Normal appearance. She is normal weight.  HENT:     Head: Normocephalic and atraumatic.  Cardiovascular:     Rate and Rhythm: Normal rate and regular  rhythm.  Pulmonary:     Effort: Pulmonary effort is normal.     Breath sounds: Normal breath sounds.  Abdominal:     General: Abdomen is flat. Bowel sounds are normal.     Palpations: Abdomen is soft.  Genitourinary:    General: Normal vulva.  Musculoskeletal:        General: Normal range of motion.     Cervical back: Normal range of motion and neck supple.  Skin:    General: Skin is warm and dry.  Neurological:     General: No focal deficit present.     Mental Status: She is alert and oriented to person, place, and time.  Psychiatric:        Mood and Affect: Mood normal.        Behavior: Behavior normal.    Prenatal labs: ABO, Rh: --/--/O POS (06/13 4540) Antibody: NEG (06/13 0952) Rubella:   RPR: NON REACTIVE (06/13 0946)  HBsAg:    HIV:    GBS:     Assessment/Plan: 39w AMA Breech , declined ECV Borderline pelvimetry Primary csection. Consent done. Risks of anesthesia , infection, bleeding with injury to surrounding organs with need for repair discussed. Pt acknowledges and will proceed.    [name] J 02/22/2021, 12:33 PM

## 2021-02-22 NOTE — Anesthesia Postprocedure Evaluation (Signed)
Anesthesia Post Note  Patient: Carol Harper  Procedure(s) Performed: Primary CESAREAN SECTION     Patient location during evaluation: PACU Anesthesia Type: Spinal Level of consciousness: awake and alert and oriented Pain management: pain level controlled Vital Signs Assessment: post-procedure vital signs reviewed and stable Respiratory status: spontaneous breathing, nonlabored ventilation and respiratory function stable Cardiovascular status: blood pressure returned to baseline and stable Postop Assessment: no headache, no backache, spinal receding, patient able to bend at knees and no apparent nausea or vomiting Anesthetic complications: no   No notable events documented.  Last Vitals:  Vitals:   02/22/21 1730 02/22/21 1745  BP: 109/64 119/77  Pulse: 84 91  Resp: 16 18  Temp: 36.6 C   SpO2: 99% 100%    Last Pain:  Vitals:   02/22/21 1730  TempSrc: Oral   Pain Goal:                Epidural/Spinal Function Cutaneous sensation: Tingles (02/22/21 1745), Patient able to flex knees: Yes (02/22/21 1745), Patient able to lift hips off bed: No (02/22/21 1745), Back pain beyond tenderness at insertion site: No (02/22/21 1745), Progressively worsening motor and/or sensory loss: No (02/22/21 1745), Bowel and/or bladder incontinence post epidural: No (02/22/21 1745)  Pervis Hocking

## 2021-02-22 NOTE — Op Note (Signed)
Cesarean Section Procedure Note  Indications: malpresentation: frank breech  Pre-operative Diagnosis: 39 week 1 day pregnancy.  Post-operative Diagnosis: same  Surgeon: Lovenia Kim   Assistants: Eddie Dibbles, CNM  Anesthesia: Local anesthesia 0.25.% bupivacaine and Spinal anesthesia  ASA Class: 2  Procedure Details  The patient was seen in the Holding Room. The risks, benefits, complications, treatment options, and expected outcomes were discussed with the patient.  The patient concurred with the proposed plan, giving informed consent. The risks of anesthesia, infection, bleeding and possible injury to other organs discussed. Injury to bowel, bladder, or ureter with possible need for repair discussed. Possible need for transfusion with secondary risks of hepatitis or HIV acquisition discussed. Post operative complications to include but not limited to DVT, PE and Pneumonia noted. The site of surgery properly noted/marked. The patient was taken to Operating Room # C, identified as Carol Harper and the procedure verified as C-Section Delivery. A Time Out was held and the above information confirmed.  After induction of anesthesia, the patient was draped and prepped in the usual sterile manner. A Pfannenstiel incision was made and carried down through the subcutaneous tissue to the fascia. Fascial incision was made and extended transversely using Mayo scissors. The fascia was separated from the underlying rectus tissue superiorly and inferiorly. The peritoneum was identified and entered. Peritoneal incision was extended longitudinally. The utero-vesical peritoneal reflection was incised transversely and the bladder flap was bluntly freed from the lower uterine segment. A low transverse uterine incision(Kerr hysterotomy) was made. Delivered from frank breech presentation was a  female with Apgar scores of 8 at one minute and 9 at five minutes. Bulb suctioning gently performed. Neonatal team in  attendance.After the umbilical cord was clamped and cut cord blood was obtained for evaluation. The placenta was removed intact and appeared normal. The uterus was curetted with a dry lap pack. Good hemostasis was noted.The uterine outline, tubes and ovaries appeared normal. The uterine incision was closed with running locked sutures of 0 Monocryl x 2 layers. Hemostasis was observed. Lavage was carried out until clear.The parietal peritoneum was closed with a running 2-0 Monocryl suture. The fascia was then reapproximated with running sutures of 0 Monocryl. The skin was reapproximated with 4-0 vicryl after [place] closure with 2-0 plain.  Instrument, sponge, and needle counts were correct prior the abdominal closure and at the conclusion of the case.   Findings: FTLF, frank breech, anterior placenta  Estimated Blood Loss:   500cc         Drains: foley                 Specimens: placenta                 Complications:  None; patient tolerated the procedure well.         Disposition: PACU - hemodynamically stable.         Condition: stable  Attending Attestation: I performed the procedure.

## 2021-02-22 NOTE — Anesthesia Procedure Notes (Addendum)
Spinal  Patient location during procedure: OR Start time: 02/22/2021 3:47 PM End time: 02/22/2021 3:50 PM Reason for block: surgical anesthesia Staffing Performed: anesthesiologist  Anesthesiologist: Pervis Hocking, DO Preanesthetic Checklist Completed: patient identified, IV checked, risks and benefits discussed, surgical consent, monitors and equipment checked, pre-op evaluation and timeout performed Spinal Block Patient position: sitting Prep: DuraPrep and site prepped and draped Patient monitoring: cardiac monitor, continuous pulse ox and blood pressure Approach: midline Location: L3-4 Injection technique: single-shot Needle Needle type: Pencan  Needle gauge: 24 G Needle length: 9 cm Assessment Sensory level: T6 Events: CSF return Additional Notes Functioning IV was confirmed and monitors were applied. Sterile prep and drape, including hand hygiene and sterile gloves were used. The patient was positioned and the spine was prepped. The skin was anesthetized with lidocaine.  Free flow of clear CSF was obtained prior to injecting local anesthetic into the CSF.  The spinal needle aspirated freely following injection.  The needle was carefully withdrawn.  The patient tolerated the procedure well.

## 2021-02-23 DIAGNOSIS — O9902 Anemia complicating childbirth: Secondary | ICD-10-CM | POA: Diagnosis not present

## 2021-02-23 LAB — CBC
HCT: 24.6 % — ABNORMAL LOW (ref 36.0–46.0)
Hemoglobin: 8.1 g/dL — ABNORMAL LOW (ref 12.0–15.0)
MCH: 30 pg (ref 26.0–34.0)
MCHC: 32.9 g/dL (ref 30.0–36.0)
MCV: 91.1 fL (ref 80.0–100.0)
Platelets: 166 10*3/uL (ref 150–400)
RBC: 2.7 MIL/uL — ABNORMAL LOW (ref 3.87–5.11)
RDW: 14.5 % (ref 11.5–15.5)
WBC: 18.1 10*3/uL — ABNORMAL HIGH (ref 4.0–10.5)
nRBC: 0 % (ref 0.0–0.2)

## 2021-02-23 MED ORDER — SODIUM CHLORIDE 0.9 % IV SOLN
400.0000 mg | Freq: Once | INTRAVENOUS | Status: AC
  Administered 2021-02-23: 400 mg via INTRAVENOUS
  Filled 2021-02-23: qty 20

## 2021-02-23 MED ORDER — MAGNESIUM OXIDE -MG SUPPLEMENT 400 (240 MG) MG PO TABS
400.0000 mg | ORAL_TABLET | Freq: Every day | ORAL | Status: DC
  Administered 2021-02-24 – 2021-02-25 (×2): 400 mg via ORAL
  Filled 2021-02-23 (×2): qty 1

## 2021-02-23 MED ORDER — SODIUM CHLORIDE 0.9 % IV SOLN
100.0000 mg | Freq: Once | INTRAVENOUS | Status: AC
  Administered 2021-02-23: 100 mg via INTRAVENOUS
  Filled 2021-02-23: qty 5

## 2021-02-23 MED ORDER — POLYSACCHARIDE IRON COMPLEX 150 MG PO CAPS: 150.0000 mg | ORAL_CAPSULE | Freq: Every day | ORAL | Status: DC

## 2021-02-23 MED ORDER — OXYCODONE HCL 5 MG PO TABS
5.0000 mg | ORAL_TABLET | ORAL | Status: DC | PRN
  Administered 2021-02-23 – 2021-02-24 (×5): 5 mg via ORAL
  Filled 2021-02-23 (×4): qty 1

## 2021-02-23 MED ORDER — POLYSACCHARIDE IRON COMPLEX 150 MG PO CAPS
150.0000 mg | ORAL_CAPSULE | Freq: Every day | ORAL | Status: DC
  Administered 2021-02-24 – 2021-02-25 (×2): 150 mg via ORAL
  Filled 2021-02-23 (×2): qty 1

## 2021-02-23 MED ORDER — ACETAMINOPHEN 500 MG PO TABS
1000.0000 mg | ORAL_TABLET | Freq: Four times a day (QID) | ORAL | Status: DC
  Administered 2021-02-23 – 2021-02-25 (×8): 1000 mg via ORAL
  Filled 2021-02-23 (×8): qty 2

## 2021-02-23 NOTE — Progress Notes (Signed)
Subjective: Postpartum Day 1: Cesarean Delivery Patient reports incisional pain, tolerating PO, + flatus, + BM, and no problems voiding.    Objective: Vital signs in last 24 hours: Temp:  [97.7 F (36.5 C)-98.5 F (36.9 C)] 98.1 F (36.7 C) (06/16 0815) Pulse Rate:  [73-102] 84 (06/16 0815) Resp:  [13-23] 20 (06/16 0815) BP: (94-137)/(64-101) 125/86 (06/16 0815) SpO2:  [98 %-100 %] 100 % (06/16 0815)  Physical Exam:  General: alert, cooperative, and appears stated age Lochia: appropriate Uterine Fundus: firm Incision: healing well, no significant drainage DVT Evaluation: Negative Homan's sign. No cords or calf tenderness.  Recent Labs    02/23/21 0532  HGB 8.1*  HCT 24.6*    Assessment/Plan: Status post Cesarean section. Doing well postoperatively.  Anemia- asymtomatic Fe transfusion Continue current care.  Carol Harper J 02/23/2021, 9:49 AM

## 2021-02-24 MED ORDER — OXYCODONE HCL 5 MG PO TABS
10.0000 mg | ORAL_TABLET | ORAL | Status: DC | PRN
  Administered 2021-02-24 – 2021-02-25 (×4): 10 mg via ORAL
  Filled 2021-02-24 (×4): qty 2

## 2021-02-24 NOTE — Progress Notes (Signed)
Subjective: POD# 2 Live born female  Birth Weight: 7 lb 9.2 oz (3435 g) APGAR: 9, 9  Newborn Delivery   Birth date/time: 02/22/2021 16:11:00 Delivery type: C-Section, Low Transverse Trial of labor: No C-section categorization: Primary     Baby name: Addilyn Delivering provider: Brien Few   Feeding: bottle  Pain control at delivery: Spinal   Reports feeling much better this AM. Pain is well controlled. Formula feeding only. Husband at the bedside.   Patient reports tolerating PO.   Pain controlled with acetaminophen, ibuprofen (OTC), and narcotic analgesics including oxycodone (Oxycontin, Oxyir) Denies HA/SOB/C/P/N/V/dizziness. She reports vaginal bleeding as normal, without clots. She is ambulating and urinating without difficulty.     Objective:   Vitals:   02/23/21 0815 02/23/21 1504 02/23/21 2115 02/24/21 0555  BP: 125/86 115/65 116/81 109/77  Pulse: 84 (!) 101 98 84  Resp: 20  18 17   Temp: 98.1 F (36.7 C) 98.3 F (36.8 C) 97.8 F (36.6 C) 98 F (36.7 C)  TempSrc: Oral Axillary Oral   SpO2: 100%  100%     No intake or output data in the 24 hours ending 02/24/21 1038      Recent Labs    02/23/21 0532  WBC 18.1*  HGB 8.1*  HCT 24.6*  PLT 166    Blood type: --/--/O POS (06/13 1638)  Rubella:   Immune  Vaccines:   TDaP          UTD                      COVID-19 UTD  Physical Exam:  General: alert and cooperative CV: Regular rate and rhythm Resp: clear Abdomen: soft, nontender, normal bowel sounds Incision: clean, dry, and intact Uterine Fundus: firm, below umbilicus, nontender Lochia: moderate Ext: extremities normal, atraumatic, no cyanosis or edema  Assessment/Plan: 35 y.o.   POD# 2. G1P1001                  Principal Problem:   Postpartum care following cesarean delivery 6/15  Encourage rest when baby rests Discussed binding breasts with tight bra Encourage to ambulate Routine post-op care Active Problems:   Complete breech  presentation, single or unspecified fetus   Cesarean delivery - breech   Maternal anemia, with delivery - ABL  S/P iron transfusion yesterday  Asymptomatic  Start PO iron and mag oxide  Anticipate discharge tomorrow.   Suzan Nailer, CNM, MSN 02/24/2021, 10:38 AM

## 2021-02-24 NOTE — Plan of Care (Signed)
  Problem: Pain Managment: Goal: General experience of comfort will improve Note: Patient states that 5 mg oxycodone does not seem to be working for her pain as well today and she is still uncomfortable, even with the oxycodone, ibuprofen and tylenol together. Called Gavin Potters, CNM, requesting an order for 10 mg of oxycodone. Gavin Potters ordered that patient may receive 10 mg oxycodone if 5 mg has not been effective or for severe pain. Also requested that patient receive an additional 5 mg dose even after patient received 5 mg 2 hours prior. Gavin Potters OK with providing another 5 mg dose and then begin 10 mg in 4 hours.  Maxwell Caul, Leretha Dykes [place]

## 2021-02-25 MED ORDER — ACETAMINOPHEN 500 MG PO TABS: 1000.0000 mg | ORAL_TABLET | Freq: Four times a day (QID) | ORAL | 0 refills | Status: AC

## 2021-02-25 MED ORDER — IBUPROFEN 600 MG PO TABS: 600.0000 mg | ORAL_TABLET | Freq: Four times a day (QID) | ORAL | 0 refills | Status: AC

## 2021-02-25 MED ORDER — MAGNESIUM OXIDE -MG SUPPLEMENT 400 (240 MG) MG PO TABS: 400.0000 mg | ORAL_TABLET | Freq: Every day | ORAL | 1 refills | Status: AC

## 2021-02-25 MED ORDER — POLYSACCHARIDE IRON COMPLEX 150 MG PO CAPS: 150.0000 mg | ORAL_CAPSULE | Freq: Every day | ORAL | 1 refills | Status: AC

## 2021-02-25 MED ORDER — OXYCODONE HCL 5 MG PO TABS: 5.0000 mg | ORAL_TABLET | ORAL | 0 refills | Status: AC | PRN

## 2021-02-25 NOTE — Discharge Instructions (Signed)
Remove honeycomb dressing on 6/22 (7 days post op) unless it becomes saturated before per Dr. Pamala Hurry

## 2021-02-25 NOTE — Discharge Summary (Signed)
OB Discharge Summary  Patient Name: Carol Harper DOB: 1985-09-19 MRN: [id number]  Date of admission: 02/22/2021 Delivering MD: Brien Few   Date of discharge: 02/25/2021  Admitting diagnosis: Complete breech presentation, single or unspecified fetus [O32.1XX0] Intrauterine pregnancy: [redacted]w[redacted]d     Secondary diagnosis:Principal Problem:   Postpartum care following cesarean delivery 6/15 Active Problems:   Complete breech presentation, single or unspecified fetus   Cesarean delivery - breech   Maternal anemia, with delivery - ABL  Additional problems: None     Discharge diagnosis: Term Pregnancy Delivered                                                                     Post partum procedures: IV iron  Augmentation: N/A  Complications: None  Hospital course:  Sceduled C/S   35 y.o. yo G1P1001 at [redacted]w[redacted]d was admitted to the hospital 02/22/2021 for scheduled cesarean section with the following indication:Malpresentation.Delivery details are as follows:  Membrane Rupture Time/Date: 4:11 PM ,02/22/2021   Delivery Method:C-Section, Low Transverse  Details of operation can be found in separate operative note.  Patient had an uncomplicated postpartum course.  She is ambulating, tolerating a regular diet, passing flatus, and urinating well. Patient is discharged home in stable condition on  02/25/21        Newborn Data: Birth date:02/22/2021  Birth time:4:11 PM  Gender:Female  Living status:Living  Apgars:9 ,9  Weight:3435 g     On postop day 3 patient notes no fever, pain controlled with oral medications.  No bowel movement yet but good flatus and tolerating regular diet.  Appropriate lochia.  No dizziness or chest pain.  Patient does note lower extremity swelling.  Physical exam  Vitals:   02/24/21 0555 02/24/21 1352 02/24/21 2200 02/25/21 0606  BP: 109/77 127/87 118/81 127/85  Pulse: 84 88 91 83  Resp: 17 16 18 16   Temp: 98 F (36.7 C) 97.7 F (36.5 C) 98 F  (36.7 C) 97.7 F (36.5 C)  TempSrc:  Oral Oral Oral  SpO2:  100%  98%   General: alert, cooperative, and no distress Lochia: appropriate Uterine Fundus: firm Incision: Healing well with no significant drainage DVT Evaluation: No evidence of DVT seen on physical exam. Lower extremity: 2+ edema Labs: Lab Results  Component Value Date   WBC 18.1 (H) 02/23/2021   HGB 8.1 (L) 02/23/2021   HCT 24.6 (L) 02/23/2021   MCV 91.1 02/23/2021   PLT 166 02/23/2021   CMP Latest Ref Rng & Units 11/05/2011  Glucose 70 - 99 mg/dL 96  BUN 6 - 23 mg/dL 9  Creatinine 0.50 - 1.10 mg/dL 0.69  Sodium 135 - 145 mEq/L 138  Potassium 3.5 - 5.1 mEq/L 3.7  Chloride 96 - 112 mEq/L 104  CO2 19 - 32 mEq/L 25  Calcium 8.4 - 10.5 mg/dL 9.4  Total Protein 6.0 - 8.3 g/dL 7.1  Total Bilirubin 0.3 - 1.2 mg/dL 0.5  Alkaline Phos 39 - 117 U/L 58  AST 0 - 37 U/L 134(H)  ALT 0 - 35 U/L 48(H)    Discharge instruction: per After Visit Summary and "Baby and Me Booklet".  After Visit Meds:  Allergies as of 02/25/2021  Reactions   Codeine Rash        Medication List     TAKE these medications    acetaminophen 500 MG tablet Commonly known as: TYLENOL Take 2 tablets (1,000 mg total) by mouth every 6 (six) hours.   ibuprofen 600 MG tablet Commonly known as: ADVIL Take 1 tablet (600 mg total) by mouth every 6 (six) hours.   iron polysaccharides 150 MG capsule Commonly known as: NIFEREX Take 1 capsule (150 mg total) by mouth daily. Start taking on: February 26, 2021   magnesium oxide 400 (240 Mg) MG tablet Commonly known as: MAG-OX Take 1 tablet (400 mg total) by mouth daily. Start taking on: February 26, 2021   oxyCODONE 5 MG immediate release tablet Commonly known as: Oxy IR/ROXICODONE Take 1 tablet (5 mg total) by mouth every 4 (four) hours as needed for moderate pain.   prenatal vitamin w/FE, FA 27-1 MG Tabs tablet Take 1 tablet by mouth daily at 12 noon.        Diet: routine  diet  Activity: Advance as tolerated. Pelvic rest for 6 weeks.   Outpatient follow up:6 weeks Follow up Appt:No future appointments. Follow up visit: No follow-ups on file.  Postpartum contraception: Not Discussed  Newborn Data: Live born female  Birth Weight: 7 lb 9.2 oz (3435 g) APGAR: 31, 9  Newborn Delivery   Birth date/time: 02/22/2021 16:11:00 Delivery type: C-Section, Low Transverse Trial of labor: No C-section categorization: Primary      Baby Feeding: Bottle Disposition:home with mother   02/25/2021 Ala Dach, MD
